# Patient Record
Sex: Male | Born: 1955 | Race: Black or African American | Hispanic: No | Marital: Married | Smoking: Former smoker
Health system: Southern US, Community
[De-identification: ages and names within clinical notes are randomized; demographics above are authoritative.]

## PROBLEM LIST (undated history)

## (undated) DIAGNOSIS — T4145XA Adverse effect of unspecified anesthetic, initial encounter: Secondary | ICD-10-CM

## (undated) DIAGNOSIS — T8859XA Other complications of anesthesia, initial encounter: Secondary | ICD-10-CM

## (undated) DIAGNOSIS — N529 Male erectile dysfunction, unspecified: Secondary | ICD-10-CM

## (undated) DIAGNOSIS — T7840XA Allergy, unspecified, initial encounter: Secondary | ICD-10-CM

## (undated) DIAGNOSIS — I10 Essential (primary) hypertension: Secondary | ICD-10-CM

## (undated) DIAGNOSIS — E785 Hyperlipidemia, unspecified: Secondary | ICD-10-CM

## (undated) DIAGNOSIS — E669 Obesity, unspecified: Secondary | ICD-10-CM

## (undated) DIAGNOSIS — Z973 Presence of spectacles and contact lenses: Secondary | ICD-10-CM

## (undated) DIAGNOSIS — K219 Gastro-esophageal reflux disease without esophagitis: Secondary | ICD-10-CM

## (undated) DIAGNOSIS — L209 Atopic dermatitis, unspecified: Secondary | ICD-10-CM

## (undated) HISTORY — DX: Essential (primary) hypertension: I10

## (undated) HISTORY — PX: PATELLA RECONSTRUCTION: SHX736

## (undated) HISTORY — DX: Obesity, unspecified: E66.9

## (undated) HISTORY — DX: Gastro-esophageal reflux disease without esophagitis: K21.9

## (undated) HISTORY — DX: Atopic dermatitis, unspecified: L20.9

## (undated) HISTORY — PX: POLYPECTOMY: SHX149

## (undated) HISTORY — DX: Hyperlipidemia, unspecified: E78.5

## (undated) HISTORY — DX: Male erectile dysfunction, unspecified: N52.9

## (undated) HISTORY — PX: TONSILLECTOMY: SUR1361

## (undated) HISTORY — DX: Allergy, unspecified, initial encounter: T78.40XA

---

## 1988-12-24 HISTORY — PX: LARYNGOSCOPY: SUR817

## 1993-12-24 HISTORY — PX: ARTHROSCOPIC REPAIR ACL: SUR80

## 1998-06-23 ENCOUNTER — Encounter: Admission: RE | Admit: 1998-06-23 | Discharge: 1998-09-21 | Payer: Self-pay | Admitting: *Deleted

## 1998-09-16 ENCOUNTER — Ambulatory Visit (HOSPITAL_COMMUNITY): Admission: RE | Admit: 1998-09-16 | Discharge: 1998-09-16 | Payer: Self-pay | Admitting: Gastroenterology

## 2000-12-24 HISTORY — PX: DORSAL COMPARTMENT RELEASE: SHX1474

## 2001-02-19 ENCOUNTER — Ambulatory Visit (HOSPITAL_BASED_OUTPATIENT_CLINIC_OR_DEPARTMENT_OTHER): Admission: RE | Admit: 2001-02-19 | Discharge: 2001-02-19 | Payer: Self-pay | Admitting: Orthopedic Surgery

## 2005-12-07 ENCOUNTER — Emergency Department (HOSPITAL_COMMUNITY): Admission: EM | Admit: 2005-12-07 | Discharge: 2005-12-07 | Payer: Self-pay | Admitting: Emergency Medicine

## 2006-01-08 ENCOUNTER — Encounter: Admission: RE | Admit: 2006-01-08 | Discharge: 2006-01-08 | Payer: Self-pay | Admitting: Family Medicine

## 2006-06-21 ENCOUNTER — Ambulatory Visit: Payer: Self-pay | Admitting: Family Medicine

## 2006-06-27 ENCOUNTER — Encounter: Admission: RE | Admit: 2006-06-27 | Discharge: 2006-06-27 | Payer: Self-pay | Admitting: Family Medicine

## 2006-12-18 ENCOUNTER — Ambulatory Visit: Payer: Self-pay | Admitting: Family Medicine

## 2006-12-19 ENCOUNTER — Ambulatory Visit: Payer: Self-pay | Admitting: Family Medicine

## 2006-12-20 ENCOUNTER — Ambulatory Visit: Payer: Self-pay | Admitting: Family Medicine

## 2007-01-14 ENCOUNTER — Ambulatory Visit: Payer: Self-pay | Admitting: Family Medicine

## 2007-01-24 ENCOUNTER — Ambulatory Visit: Payer: Self-pay | Admitting: Family Medicine

## 2007-02-11 ENCOUNTER — Ambulatory Visit: Payer: Self-pay | Admitting: Family Medicine

## 2007-02-24 ENCOUNTER — Ambulatory Visit: Payer: Self-pay | Admitting: Family Medicine

## 2007-03-25 ENCOUNTER — Ambulatory Visit: Payer: Self-pay | Admitting: Family Medicine

## 2007-06-18 ENCOUNTER — Ambulatory Visit: Payer: Self-pay | Admitting: Family Medicine

## 2007-08-11 ENCOUNTER — Ambulatory Visit: Payer: Self-pay | Admitting: Family Medicine

## 2007-08-15 ENCOUNTER — Encounter: Admission: RE | Admit: 2007-08-15 | Discharge: 2007-08-15 | Payer: Self-pay | Admitting: Family Medicine

## 2007-09-12 ENCOUNTER — Ambulatory Visit: Payer: Self-pay | Admitting: Family Medicine

## 2007-09-26 ENCOUNTER — Ambulatory Visit: Payer: Self-pay | Admitting: Family Medicine

## 2007-12-12 ENCOUNTER — Ambulatory Visit: Payer: Self-pay | Admitting: Family Medicine

## 2008-03-29 ENCOUNTER — Ambulatory Visit: Payer: Self-pay | Admitting: Family Medicine

## 2008-03-31 ENCOUNTER — Ambulatory Visit: Payer: Self-pay | Admitting: Family Medicine

## 2008-05-14 ENCOUNTER — Ambulatory Visit: Payer: Self-pay | Admitting: Family Medicine

## 2008-05-28 ENCOUNTER — Ambulatory Visit: Payer: Self-pay | Admitting: Family Medicine

## 2008-06-01 ENCOUNTER — Ambulatory Visit: Payer: Self-pay | Admitting: Family Medicine

## 2008-06-18 ENCOUNTER — Ambulatory Visit: Payer: Self-pay | Admitting: Family Medicine

## 2008-07-09 ENCOUNTER — Ambulatory Visit: Payer: Self-pay | Admitting: Family Medicine

## 2008-08-03 ENCOUNTER — Ambulatory Visit: Payer: Self-pay | Admitting: Family Medicine

## 2008-08-20 ENCOUNTER — Ambulatory Visit: Payer: Self-pay | Admitting: Family Medicine

## 2008-08-24 ENCOUNTER — Encounter: Admission: RE | Admit: 2008-08-24 | Discharge: 2008-08-24 | Payer: Self-pay | Admitting: Family Medicine

## 2008-08-24 ENCOUNTER — Ambulatory Visit: Payer: Self-pay | Admitting: Family Medicine

## 2008-09-22 ENCOUNTER — Ambulatory Visit: Payer: Self-pay | Admitting: Family Medicine

## 2008-11-08 ENCOUNTER — Ambulatory Visit: Payer: Self-pay | Admitting: Family Medicine

## 2008-11-22 ENCOUNTER — Ambulatory Visit: Payer: Self-pay | Admitting: Family Medicine

## 2008-11-25 ENCOUNTER — Encounter: Admission: RE | Admit: 2008-11-25 | Discharge: 2008-11-25 | Payer: Self-pay | Admitting: Family Medicine

## 2008-11-26 ENCOUNTER — Ambulatory Visit: Payer: Self-pay | Admitting: Family Medicine

## 2009-04-11 ENCOUNTER — Ambulatory Visit: Payer: Self-pay | Admitting: Family Medicine

## 2009-05-13 ENCOUNTER — Ambulatory Visit: Payer: Self-pay | Admitting: Family Medicine

## 2009-06-17 ENCOUNTER — Ambulatory Visit: Payer: Self-pay | Admitting: Family Medicine

## 2009-07-11 ENCOUNTER — Ambulatory Visit: Payer: Self-pay | Admitting: Family Medicine

## 2009-07-21 ENCOUNTER — Ambulatory Visit: Payer: Self-pay | Admitting: Family Medicine

## 2009-08-08 ENCOUNTER — Emergency Department (HOSPITAL_COMMUNITY): Admission: EM | Admit: 2009-08-08 | Discharge: 2009-08-09 | Payer: Self-pay | Admitting: Emergency Medicine

## 2009-10-07 ENCOUNTER — Ambulatory Visit: Payer: Self-pay | Admitting: Family Medicine

## 2010-02-06 ENCOUNTER — Ambulatory Visit: Payer: Self-pay | Admitting: Family Medicine

## 2010-02-09 ENCOUNTER — Ambulatory Visit: Payer: Self-pay | Admitting: Family Medicine

## 2010-02-23 ENCOUNTER — Ambulatory Visit: Payer: Self-pay | Admitting: Family Medicine

## 2010-04-10 ENCOUNTER — Ambulatory Visit: Payer: Self-pay | Admitting: Family Medicine

## 2010-04-13 ENCOUNTER — Ambulatory Visit: Payer: Self-pay | Admitting: Family Medicine

## 2010-05-10 ENCOUNTER — Ambulatory Visit: Payer: Self-pay | Admitting: Family Medicine

## 2010-06-21 ENCOUNTER — Emergency Department (HOSPITAL_COMMUNITY): Admission: EM | Admit: 2010-06-21 | Discharge: 2010-06-21 | Payer: Self-pay | Admitting: Emergency Medicine

## 2010-08-18 ENCOUNTER — Ambulatory Visit: Payer: Self-pay | Admitting: Family Medicine

## 2010-08-24 ENCOUNTER — Emergency Department (HOSPITAL_COMMUNITY): Admission: EM | Admit: 2010-08-24 | Discharge: 2010-08-24 | Payer: Self-pay | Admitting: Emergency Medicine

## 2010-08-29 ENCOUNTER — Ambulatory Visit: Payer: Self-pay | Admitting: Family Medicine

## 2010-09-28 ENCOUNTER — Ambulatory Visit: Payer: Self-pay | Admitting: Family Medicine

## 2011-02-19 ENCOUNTER — Ambulatory Visit (INDEPENDENT_AMBULATORY_CARE_PROVIDER_SITE_OTHER): Payer: BC Managed Care – PPO | Admitting: Family Medicine

## 2011-02-19 DIAGNOSIS — Z79899 Other long term (current) drug therapy: Secondary | ICD-10-CM

## 2011-02-19 DIAGNOSIS — R42 Dizziness and giddiness: Secondary | ICD-10-CM

## 2011-03-02 ENCOUNTER — Encounter (INDEPENDENT_AMBULATORY_CARE_PROVIDER_SITE_OTHER): Payer: BC Managed Care – PPO | Admitting: Family Medicine

## 2011-03-02 DIAGNOSIS — M109 Gout, unspecified: Secondary | ICD-10-CM

## 2011-03-02 DIAGNOSIS — I10 Essential (primary) hypertension: Secondary | ICD-10-CM

## 2011-03-02 DIAGNOSIS — E785 Hyperlipidemia, unspecified: Secondary | ICD-10-CM

## 2011-03-02 DIAGNOSIS — Z0000 Encounter for general adult medical examination without abnormal findings: Secondary | ICD-10-CM

## 2011-03-11 LAB — LIPASE, BLOOD: Lipase: 23 U/L (ref 11–59)

## 2011-03-11 LAB — CBC
Hemoglobin: 16 g/dL (ref 13.0–17.0)
MCH: 27.7 pg (ref 26.0–34.0)
RBC: 5.79 MIL/uL (ref 4.22–5.81)
RDW: 16.5 % — ABNORMAL HIGH (ref 11.5–15.5)
WBC: 7.4 10*3/uL (ref 4.0–10.5)

## 2011-03-11 LAB — COMPREHENSIVE METABOLIC PANEL
ALT: 15 U/L (ref 0–53)
Alkaline Phosphatase: 71 U/L (ref 39–117)
CO2: 21 mEq/L (ref 19–32)
Chloride: 102 mEq/L (ref 96–112)
GFR calc non Af Amer: >60 mL/min (ref >60)
Glucose, Bld: 119 mg/dL — ABNORMAL HIGH (ref 70–99)
Potassium: 3.7 mEq/L (ref 3.5–5.1)
Sodium: 133 mEq/L — ABNORMAL LOW (ref 135–145)
Total Bilirubin: 0.9 mg/dL (ref 0.3–1.2)

## 2011-03-11 LAB — DIFFERENTIAL
Basophils Absolute: 0 10*3/uL (ref 0.0–0.1)
Lymphocytes Relative: 22 % (ref 12–46)
Neutro Abs: 5.2 10*3/uL (ref 1.7–7.7)
Neutrophils Relative %: 70 % (ref 43–77)

## 2011-03-31 LAB — POCT I-STAT, CHEM 8
BUN: 21 mg/dL (ref 6–23)
Calcium, Ion: 1.03 mmol/L — ABNORMAL LOW (ref 1.12–1.32)
Chloride: 106 meq/L (ref 96–112)
Creatinine, Ser: 1 mg/dL (ref 0.4–1.5)
Glucose, Bld: 88 mg/dL (ref 70–99)
HCT: 43 % (ref 39.0–52.0)
Hemoglobin: 14.6 g/dL (ref 13.0–17.0)
Potassium: 4.5 mEq/L (ref 3.5–5.1)
Sodium: 137 mEq/L (ref 135–145)
TCO2: 23 mmol/L (ref 0–100)

## 2011-03-31 LAB — D-DIMER, QUANTITATIVE: D-Dimer, Quant: <0.22 ug{FEU}/mL (ref 0.00–0.48)

## 2011-03-31 LAB — POCT CARDIAC MARKERS
CKMB, poc: 1.2 ng/mL (ref 1.0–8.0)
CKMB, poc: <1 ng/mL — ABNORMAL LOW (ref 1.0–8.0)
Myoglobin, poc: 67.5 ng/mL (ref 12–200)
Myoglobin, poc: 89.3 ng/mL (ref 12–200)
Troponin i, poc: <0.05 ng/mL (ref 0.00–0.09)
Troponin i, poc: <0.05 ng/mL (ref 0.00–0.09)

## 2011-04-02 ENCOUNTER — Ambulatory Visit (INDEPENDENT_AMBULATORY_CARE_PROVIDER_SITE_OTHER): Payer: BC Managed Care – PPO | Admitting: Family Medicine

## 2011-04-02 DIAGNOSIS — E291 Testicular hypofunction: Secondary | ICD-10-CM

## 2011-04-02 DIAGNOSIS — Z719 Counseling, unspecified: Secondary | ICD-10-CM

## 2011-04-02 DIAGNOSIS — E785 Hyperlipidemia, unspecified: Secondary | ICD-10-CM

## 2011-04-04 ENCOUNTER — Ambulatory Visit (INDEPENDENT_AMBULATORY_CARE_PROVIDER_SITE_OTHER): Payer: BC Managed Care – PPO | Admitting: Family Medicine

## 2011-04-04 DIAGNOSIS — E291 Testicular hypofunction: Secondary | ICD-10-CM

## 2011-05-11 NOTE — Op Note (Signed)
[place]. [facility]  Patient:    Jay Delacruz, Jay Delacruz                      MRN: [id number] Proc. Date: 02/19/01 Adm. Date:  02/19/01 Attending:  Katy Fitch. Naaman Plummer., M.D.                           Operative Report  PREOPERATIVE DIAGNOSIS:  Status post arthrodesis - right wrist with development of severe ______ tenosynovitis - right extensor pollicis longus at third dorsal compartment.  POSTOPERATIVE DIAGNOSES:  Status post arthrodesis - right wrist with development of severe ______ tenosynovitis - right extensor pollicis longus at third dorsal compartment.  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  OPERATIONS: 1. Tenolysis of extensor pollicis longus with release of reformed third dorsal    compartment. 2. Removal of screw from floor of third dorsal compartment.  SURGEON:  Katy Fitch. Sypher, Montez Hageman., M.D.  ASSISTANT:  Marveen Reeks Dasnoit, P.A.-C.  ANESTHESIA:  General by LMA.  SUPERVISING ANESTHESIOLOGIST:  Dr. Burna Forts.  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  Due to a failure to respond to nonoperative measures, he underwent arthrodesis of his wrist with a semitubular plate.  Postoperatively, he had excellent pain relief and has gone on to a near complete arthrodesis.  His fusion is still maturing.  For the past four weeks, he has chronic tenosynovitis of the extensor pollicis longus overlying the distal radius.  At the time of surgery, Listers tubercle was removed and his EPL was left in the subcutaneous region.  However, it was apparent that his EPL was causing friction against one of the dorsal screws, therefore, we recommended exploration of the third dorsal compartment and screw removal.  The prime indication  is to prevent rupture of his extensor pollicis longus tendon.  PROCEDURE:  [name] was brought to the operating room and placed in supine position on the operating table.  Following induction of general anesthesia by LMA, the right arm was prepped with Betadine soap and solution and sterilely draped.  Following exsanguination of the limb with Esmarch bandage, arterial tourniquet was inflated to 230 mmHg.  The procedure commenced with excision of a portion of the previous surgical scar on the proximal half of the wound.  Subcutaneous tissues were carefully divided, taking care to identify the extensor retinaculum that had reformed over the extensor tendons.  The fourth dorsal compartment was entered and the third dorsal compartment was entered proximal to the region of the offending screw.  The musculotendinous junction was released and the third dorsal compartment was found to be rather stenotic.  The extensor retinaculum was released over the EPL over a distance of 5 cm to the base of the thumb metacarpal.  There was noted to be a very prominent screw head at the floor of the compartment.  The other screws were covered with tenosynovium and periosteum and did not appear to be a problem.  The cortical screw was removed with a screwdriver.  Thereafter, the extensor pollicis longus underwent complete tenolysis and removal of all hypertrophic synovium.  There was no sign of impending rupture, however, approximately 10% of the fibers of the tendon had been abraded.  The remaining screws appeared to be satisfactory.  The wound was irrigated and repaired with intradermal 3-0 Prolene.  There were no apparent complications.  The tourniquet was released and Mr. Hjort was transferred to the recovery room with stable vital signs.  He will be discharged to home with prescriptions for Percocet 5/325 1-2 tablets p.o. q.4-6h. p.r.n. pain.  Also, Keflex 500 mg one p.o. q.8h. x 4  days as a prophylactic antibiotic.   He will return to see [place] in the office in approximately one week for x-ray and dressing change and advancement in his therapy program. DD:  02/19/01 TD:  02/19/01 Job: [id number]

## 2011-05-11 NOTE — Consult Note (Signed)
Jay Delacruz, Jay Delacruz                ACCOUNT NO.:  [id number]   MEDICAL RECORD NO.:  [id number]          PATIENT TYPE:  EMS   LOCATION:  ED                           FACILITY:  [facility]   PHYSICIAN:  Angelia Mould. Derrell Lolling, M.D.DATE OF BIRTH:  [birth year]   DATE OF CONSULTATION:  12/07/2005  DATE OF DISCHARGE:                                   CONSULTATION   CHIEF COMPLAINT:  Pain and swelling left inguinal area.   HISTORY OF PRESENT ILLNESS:  This is a 55 year old black man without any  prior history of any soft tissue infections and no prior history of  diabetes. He has a several day history of progressive pain and swelling in  his left groin. He saw Dr. Sharlot Gowda about 3 days ago because of pain and  swelling in his left groin and was started on doxicycline. The patient's  pain and swelling has progressed. Dr. Susann Givens sent him to the [facility]  emergency room for my evaluation.   PAST HISTORY:  Not very remarkable. He has hypertension and hyperlipidemia.   CURRENT MEDICATIONS:  Lipitor and hydrochlorothiazide.   ALLERGIES:  He denies any drug allergies.   PHYSICAL EXAMINATION:  GENERAL:  A very pleasant middle-aged black man in no  distress.  VITAL SIGNS:  He is afebrile.  ABDOMEN:  Soft and nontender, no palpable mass. There are no hernias, liver  and spleen not enlarged.  GENITOURINARY:  There is an 8 cm abscess in the medial aspect of the left  inguinal region. The penis, scrotum and testes were completely normal and  are spared. There is no skin necrosis or signs of fasciitis.   DESCRIPTION OF PROCEDURE:  This area was prepped and draped in a sterile  fashion. One percent Xylocaine with epinephrine was used as a local  infiltration anesthetic. A 6 cm transverse incision was made and I entered  the large abscess cavity. This was digitally explored and loculations were  broken up. It appeared to be a simple abscess and did not look like a MRSA  abscess. Aerobic and anaerobic  cultures were taken. The wound was irrigated  and packed loosely with iodoform gauze. Clean bandages were placed. The  patient tolerated the procedure well without any problems and felt much  better.   IMPRESSION:  Pyogenic abscess left inguinal area.   PLAN:  1.  Incision and drainage performed in the emergency room.  2.  Continue doxicycline 100 mg p.o. b.i.d.  3.  The patient is to change the bandage as instructed.  4.  The patient is see me in the office in 3-4 days as instructed.   The patient is instructed that if the packing comes out he is not to repack  it.      Angelia Mould. Derrell Lolling, M.D.  Electronically Signed     HMI/MEDQ  D:  12/07/2005  T:  12/10/2005  Job:  [id number]   cc:   Sharlot Gowda, M.D.  Fax: [phone]

## 2011-05-14 ENCOUNTER — Encounter: Payer: Self-pay | Admitting: Medical

## 2011-07-06 ENCOUNTER — Encounter: Payer: Self-pay | Admitting: Family Medicine

## 2011-07-09 ENCOUNTER — Ambulatory Visit (INDEPENDENT_AMBULATORY_CARE_PROVIDER_SITE_OTHER): Payer: BC Managed Care – PPO | Admitting: Family Medicine

## 2011-07-09 ENCOUNTER — Encounter: Payer: Self-pay | Admitting: Family Medicine

## 2011-07-09 ENCOUNTER — Other Ambulatory Visit: Payer: Self-pay | Admitting: Family Medicine

## 2011-07-09 VITALS — BP 126/80 | HR 80 | Wt 263.0 lb

## 2011-07-09 DIAGNOSIS — R946 Abnormal results of thyroid function studies: Secondary | ICD-10-CM

## 2011-07-09 LAB — T4: T4, Total: 5.4 ug/dL (ref 5.0–12.5)

## 2011-07-09 LAB — T3: T3, Total: 61.9 ng/dL — ABNORMAL LOW (ref 80.0–204.0)

## 2011-07-09 NOTE — Progress Notes (Signed)
  Subjective:    Patient ID: Jay Delacruz, male    DOB: 06/10/1956, 55 y.o.   MRN: [id number]  HPI He is here for consult. He recently was evaluated by urology and blood screening showed abnormal thyroid functions. He is having no tachycardia, skin or hair changes.  Review of Systems     Objective:   Physical Exam Alert and in no distress otherwise not examined       Assessment & Plan:  Abnormal thyroid function tests I will repeat the tests. I discussed the possibility of this being lab error. If not I will discuss this further with endocrinology.

## 2011-07-11 ENCOUNTER — Telehealth: Payer: Self-pay

## 2011-07-11 NOTE — Telephone Encounter (Signed)
Called pt left message to call me back

## 2011-07-11 NOTE — Telephone Encounter (Signed)
Pt has been informed.

## 2011-08-14 ENCOUNTER — Other Ambulatory Visit: Payer: Self-pay | Admitting: Family Medicine

## 2011-08-21 ENCOUNTER — Other Ambulatory Visit: Payer: Self-pay | Admitting: Family Medicine

## 2011-09-09 ENCOUNTER — Other Ambulatory Visit: Payer: Self-pay | Admitting: Family Medicine

## 2011-10-02 DIAGNOSIS — Z0289 Encounter for other administrative examinations: Secondary | ICD-10-CM

## 2011-11-14 ENCOUNTER — Ambulatory Visit: Payer: BC Managed Care – PPO | Admitting: Family Medicine

## 2011-12-03 ENCOUNTER — Other Ambulatory Visit: Payer: Self-pay | Admitting: Family Medicine

## 2011-12-03 NOTE — Telephone Encounter (Signed)
Is this ok?

## 2011-12-04 ENCOUNTER — Other Ambulatory Visit: Payer: Self-pay

## 2011-12-04 NOTE — Telephone Encounter (Signed)
Med called in

## 2011-12-04 NOTE — Telephone Encounter (Signed)
Called cialis in

## 2011-12-10 ENCOUNTER — Other Ambulatory Visit: Payer: Self-pay | Admitting: Family Medicine

## 2012-01-29 ENCOUNTER — Other Ambulatory Visit: Payer: Self-pay | Admitting: Family Medicine

## 2012-03-10 ENCOUNTER — Other Ambulatory Visit: Payer: Self-pay | Admitting: Family Medicine

## 2012-03-24 ENCOUNTER — Other Ambulatory Visit (INDEPENDENT_AMBULATORY_CARE_PROVIDER_SITE_OTHER): Payer: Self-pay | Admitting: General Surgery

## 2012-03-24 ENCOUNTER — Encounter: Payer: Self-pay | Admitting: Medical

## 2012-03-24 ENCOUNTER — Ambulatory Visit (INDEPENDENT_AMBULATORY_CARE_PROVIDER_SITE_OTHER): Payer: BC Managed Care – PPO | Admitting: Medical

## 2012-03-24 ENCOUNTER — Ambulatory Visit (INDEPENDENT_AMBULATORY_CARE_PROVIDER_SITE_OTHER): Payer: BC Managed Care – PPO | Admitting: General Surgery

## 2012-03-24 VITALS — BP 140/80 | HR 108 | Temp 97.0°F | Resp 12 | Ht 73.0 in | Wt 265.0 lb

## 2012-03-24 VITALS — BP 132/80 | HR 92 | Temp 98.4°F | Resp 16 | Wt 267.0 lb

## 2012-03-24 DIAGNOSIS — L723 Sebaceous cyst: Secondary | ICD-10-CM

## 2012-03-24 DIAGNOSIS — L089 Local infection of the skin and subcutaneous tissue, unspecified: Secondary | ICD-10-CM | POA: Insufficient documentation

## 2012-03-24 MED ORDER — HYDROCODONE-ACETAMINOPHEN 5-500 MG PO TABS: 1.0000 | ORAL_TABLET | ORAL | Status: DC | PRN

## 2012-03-24 NOTE — Patient Instructions (Signed)
May remove outer dressing Wednesday AM and shower.  Once packing is wet, may remove packing.  OK to be out of work tomorrow if requiring pain medication.

## 2012-03-24 NOTE — Assessment & Plan Note (Signed)
I&D performed at bedside. Removed wall of sac.   Follow up in 2-3 weeks.

## 2012-03-24 NOTE — Progress Notes (Signed)
Incision and Drainage Procedure Note  Pre-operative Diagnosis: infected sebaceous cyst of back  Post-operative Diagnosis: same  Indications: Pain, redness of known sebaceous cyst  Anesthesia: 1% lidocaine with epinephrine  Procedure Details  The procedure, risks and complications have been discussed in detail (including, infection, bleeding, need for additional procedures) with the patient, and the patient has signed consent to the procedure.  The patient was informed that the wound would be left open.    The skin was sterilely prepped and draped over the affected area in the usual fashion. After adequate local anesthesia, I&D with a #11 blade was performed on the midline back. Purulent drainage: present along with significant sebaceous material.  Sac removed as well as cheesy material The patient was observed until stable.  Findings: Sebaceous material and pus  EBL: min cc's  Drains: none  Condition: Tolerated procedure well   Complications: none.

## 2012-03-24 NOTE — Patient Instructions (Signed)
I have scheduled you an appointment at [facility] today at 3:45pm to have the sebaceous cyst excised/drained.  There contact info is: [address] [place], [place] [address] [phone]

## 2012-03-24 NOTE — Progress Notes (Signed)
  Subjective:   HPI  Jay Delacruz is a 57 y.o. male who presents with painful swollen area on his back.  He saw Dr. Susann Givens here for similar bump a year ago, but it wasn't bothering him at that time.   Over this past weekend though is has gotten bigger, is tender now, and he wants it cut out.  He denies redness, warmth, drainage. He does report that in the past his wife has squeezed cheesy material out of it.  No other aggravating or relieving factors.    No other c/o.  The following portions of the patient's history were reviewed and updated as appropriate: allergies, current medications, past family history, past medical history, past social history, past surgical history and problem list.  Past Medical History  Diagnosis Date  . Obesity   . Hemorrhoids   . Hypertension   . ED (erectile dysfunction)   . Gout   . Atopic dermatitis     Allergies  Allergen Reactions  . Lisinopril Swelling    SWELLING TO FACE AND TONGUE     Review of Systems Gen: no fever, chills GI: no NVD ROS reviewed and was negative other than noted in HPI or above.    Objective:   Physical Exam  General appearance: alert, no distress, WD/WN Skin: mid back medially with 3.4 cm+ diameter raised tender lesion with small pore opening, slight warmth, but no fluctuance, no erythema, no current drainage   Assessment and Plan :      Encounter Diagnosis  Name Primary?  . Sebaceous cyst Yes   I scheduled him appt at [facility] today for cyst drainage, 4:15pm appt.

## 2012-03-24 NOTE — Progress Notes (Signed)
Patient ID: Jay Delacruz, male   DOB: 1956-08-01, 56 y.o.   MRN: [id number]  No chief complaint on file. sebaceous cyst of back  HPI Jay Delacruz is a 56 y.o. male.  HPI Patient is a 56 year old male with a two-year history of a mass in the middle of his back. It has not gotten significantly larger over this time period. His wife periodically is able to express cheesy material from it. It has not been infected that he knows of. However it over the past 2 days he started having increasing pain and redness over the mass. He is referred here for evaluation.  Past Medical History  Diagnosis Date  . Obesity   . Hemorrhoids   . Hypertension   . ED (erectile dysfunction)   . Gout   . Atopic dermatitis     No past surgical history on file.  No family history on file.  Social History History  Substance Use Topics  . Smoking status: Former Games developer  . Smokeless tobacco: Never Used  . Alcohol Use: Not on file    Allergies  Allergen Reactions  . Lisinopril Swelling    SWELLING TO FACE AND TONGUE    Current Outpatient Prescriptions  Medication Sig Dispense Refill  . allopurinol (ZYLOPRIM) 300 MG tablet Take 300 mg by mouth daily.        Marland Kitchen atorvastatin (LIPITOR) 40 MG tablet TAKE 1 TABLET EVERY DAY  30 tablet  0  . CIALIS 20 MG tablet TAKE 1 TABLET BY MOUTH ONCE A DAY AS NEEDED  5 tablet  9  . losartan-hydrochlorothiazide (HYZAAR) 100-12.5 MG per tablet TAKE 1 TABLET BY MOUTH ONCE A DAY  30 tablet  11  . LOVAZA 1 G capsule TAKE 4 CAPSULES BY MOUTH ONCE A DAY  120 capsule  PRN  . testosterone (TESTIM) 50 MG/5GM GEL Place 5 g onto the skin daily.        Marland Kitchen HYDROcodone-acetaminophen (VICODIN) 5-500 MG per tablet Take 1-2 tablets by mouth every 4 (four) hours as needed for pain.  20 tablet  0  . DISCONTD: losartan-hydrochlorothiazide (HYZAAR) 50-12.5 MG per tablet Take 1 tablet by mouth daily.          Review of Systems Review of Systems  All other systems reviewed and are  negative.    Blood pressure 140/80, pulse 108, temperature 97 F (36.1 C), temperature source Temporal, resp. rate 12, height 6\' 1"  (1.854 m), weight 265 lb (120.203 kg).  Physical Exam Physical Exam  Constitutional: He is oriented to person, place, and time. He appears well-developed and well-nourished. No distress.  HENT:  Head: Normocephalic and atraumatic.  Mouth/Throat: No oropharyngeal exudate.  Eyes: Conjunctivae are normal. Pupils are equal, round, and reactive to light. No scleral icterus.  Neck: Normal range of motion. Neck supple. No tracheal deviation present. No thyromegaly present.  Cardiovascular: Normal rate, regular rhythm, normal heart sounds and intact distal pulses.   Pulmonary/Chest: Effort normal. No respiratory distress.    Abdominal: Soft. Bowel sounds are normal.  Musculoskeletal: Normal range of motion.  Neurological: He is alert and oriented to person, place, and time. He has normal reflexes. Coordination normal.  Skin: Skin is warm and dry. No rash noted. He is not diaphoretic. No erythema.  Psychiatric: He has a normal mood and affect. His behavior is normal. Judgment and thought content normal.   Assessment/Plan    Infected sebaceous cyst of back I&D performed at bedside. Removed wall of sac.  Follow up in 2-3 weeks.        Shayona Hibbitts 03/24/2012, 4:40 PM

## 2012-03-27 LAB — WOUND CULTURE: Gram Stain: NONE SEEN

## 2012-04-09 ENCOUNTER — Other Ambulatory Visit: Payer: Self-pay | Admitting: Family Medicine

## 2012-04-14 ENCOUNTER — Ambulatory Visit (INDEPENDENT_AMBULATORY_CARE_PROVIDER_SITE_OTHER): Payer: BC Managed Care – PPO | Admitting: General Surgery

## 2012-04-14 VITALS — BP 142/86 | HR 86 | Temp 97.5°F | Resp 16 | Ht 72.0 in | Wt 264.4 lb

## 2012-04-14 DIAGNOSIS — L723 Sebaceous cyst: Secondary | ICD-10-CM

## 2012-04-14 DIAGNOSIS — L089 Local infection of the skin and subcutaneous tissue, unspecified: Secondary | ICD-10-CM

## 2012-04-14 NOTE — Progress Notes (Signed)
HISTORY: Pt is 3 weeks s/p I&D of infected sebaceous cyst of back.  He has been doing well.  He denies pain or drainage from the wound.      EXAM: General:  Alert and oriented Incision:  Back wound with scabbed-over incision.  No evidence of continued mass   MICROBIOLOGY: DIPTHEROIDS   ASSESSMENT AND PLAN:   Infected sebaceous cyst of back Nearly completely healed.   Follow up PRN.       Maudry Diego, MD Surgical Oncology, General & Endocrine Surgery [facility]Wellington Hampshire, MD, MD Ronnald Nian, MD

## 2012-04-14 NOTE — Assessment & Plan Note (Signed)
Nearly completely healed.   Follow up PRN.

## 2012-04-14 NOTE — Patient Instructions (Signed)
Call for recurrent problems with back cyst.

## 2012-04-21 ENCOUNTER — Encounter: Payer: Self-pay | Admitting: Medical

## 2012-04-21 ENCOUNTER — Ambulatory Visit (INDEPENDENT_AMBULATORY_CARE_PROVIDER_SITE_OTHER): Payer: BC Managed Care – PPO | Admitting: Medical

## 2012-04-21 VITALS — BP 120/80 | HR 76 | Temp 98.8°F | Resp 16 | Wt 260.0 lb

## 2012-04-21 DIAGNOSIS — M109 Gout, unspecified: Secondary | ICD-10-CM

## 2012-04-21 MED ORDER — INDOMETHACIN 50 MG PO CAPS: 50.0000 mg | ORAL_CAPSULE | Freq: Three times a day (TID) | ORAL | Status: AC

## 2012-04-21 MED ORDER — COLCHICINE 0.6 MG PO TABS: 0.6000 mg | ORAL_TABLET | Freq: Two times a day (BID) | ORAL | Status: DC

## 2012-04-21 NOTE — Patient Instructions (Signed)
For gout flare up,  Take Colcrys twice daily for flare.   This used to be like the colchicine you took every hour.  Just use this twice daily the next few days  If needed, also take the Indomethacin 50mg , 1 capsule up to 3 times daily for pain and inflammation.  Use this for as little a time as possible  Rest, stay off the foot the next few days  If worse or not improving by middle to late this week, then call or return  Drink plenty of water  Don't drink any beer this week, and avoid lots of meat this week

## 2012-04-21 NOTE — Progress Notes (Signed)
Subjective:   HPI  Jay Delacruz is a 56 y.o. male who presents for gout flare up.  This flare up started yesterday in right 3rd toe.  Normally has flare up in great toe.   He reports lots of pain.  He uses stand up tractor at work, uses feet constantly, and was able to complete job today, but doesn't think he can work like this tomorrow without treatment.  He notes some swelling of 2nd and 3rd toes on the right.  Denies redness or heat.  Feels like his normal gout flare up.  He has been drinking some beer this past week, forgot to take Allopurinol for 2 days last week, thinks this flared things up.    Hasn't had any attacks since starting Allopurinol 2011 or 2012?  Not sure.  In the past has used 2 little white pills for flare ups.  No other aggravating or relieving factors.    No other c/o.  The following portions of the patient's history were reviewed and updated as appropriate: allergies, current medications, past family history, past medical history, past social history, past surgical history and problem list.  Past Medical History  Diagnosis Date  . Obesity   . Hemorrhoids   . Hypertension   . ED (erectile dysfunction)   . Gout   . Atopic dermatitis     Allergies  Allergen Reactions  . Lisinopril Swelling    SWELLING TO FACE AND TONGUE   Review of Systems ROS reviewed and was negative other than noted in HPI or above.    Objective:   Physical Exam  General appearance: alert, no distress, WD/WN Pulses: 2+ symmetric MSK: right foot with tenderness over 2nd and 3rd toes, but no obvious erythema, swelling or warmth, otherwise feet nontender Neuro: normal sensation of feet   Assessment and Plan :     Encounter Diagnosis  Name Primary?  . Gout attack Yes   Script for Colcrys, Indocin if needed, note for out of work x 2-3 days if needed, hydrate well, no beer for now, c/t Allopurinol, call or return if worse or not improving.

## 2012-04-29 ENCOUNTER — Ambulatory Visit (INDEPENDENT_AMBULATORY_CARE_PROVIDER_SITE_OTHER): Payer: BC Managed Care – PPO | Admitting: Family Medicine

## 2012-04-29 ENCOUNTER — Encounter: Payer: Self-pay | Admitting: Family Medicine

## 2012-04-29 VITALS — BP 118/78 | HR 76 | Ht 72.5 in | Wt 250.0 lb

## 2012-04-29 DIAGNOSIS — I10 Essential (primary) hypertension: Secondary | ICD-10-CM

## 2012-04-29 DIAGNOSIS — E785 Hyperlipidemia, unspecified: Secondary | ICD-10-CM

## 2012-04-29 DIAGNOSIS — N529 Male erectile dysfunction, unspecified: Secondary | ICD-10-CM

## 2012-04-29 DIAGNOSIS — Z0000 Encounter for general adult medical examination without abnormal findings: Secondary | ICD-10-CM

## 2012-04-29 DIAGNOSIS — M109 Gout, unspecified: Secondary | ICD-10-CM

## 2012-04-29 DIAGNOSIS — E291 Testicular hypofunction: Secondary | ICD-10-CM

## 2012-04-29 DIAGNOSIS — E669 Obesity, unspecified: Secondary | ICD-10-CM

## 2012-04-29 LAB — CBC WITH DIFFERENTIAL/PLATELET
Basophils Absolute: 0 10*3/uL (ref 0.0–0.1)
HCT: 45.7 % (ref 39.0–52.0)
Hemoglobin: 15.9 g/dL (ref 13.0–17.0)
Lymphocytes Relative: 34 % (ref 12–46)
Lymphs Abs: 1.8 10*3/uL (ref 0.7–4.0)
Monocytes Absolute: 0.5 10*3/uL (ref 0.1–1.0)
Monocytes Relative: 9 % (ref 3–12)
Neutro Abs: 3 10*3/uL (ref 1.7–7.7)
RBC: 5.76 MIL/uL (ref 4.22–5.81)
RDW: 15.7 % — ABNORMAL HIGH (ref 11.5–15.5)
WBC: 5.3 10*3/uL (ref 4.0–10.5)

## 2012-04-29 LAB — POCT URINALYSIS DIPSTICK
Bilirubin, UA: NEGATIVE
Glucose, UA: NEGATIVE
Ketones, UA: NEGATIVE
Leukocytes, UA: NEGATIVE

## 2012-04-29 NOTE — Progress Notes (Signed)
  Subjective:    Patient ID: Jay Delacruz, male    DOB: May 10, 1956, 56 y.o.   MRN: [id number]  HPI He is here for complete examination. He recently had a gout attack but did respond well to the medications. He did miss several days of work. He did have me fill out an FMLA form to help with this. He continues on other medications listed in the chart and is having no difficulty with them. He is followed by urology and is using testosterone subcutaneous pellets. He has started to lose some weight recently. He is having no difficulty from his blood pressure medications. He is separated and in the process of getting a divorce. Family history is unchanged.   Review of Systems Negative except as above    Objective:   Physical Exam BP 118/78  Pulse 76  Ht 6' 0.5" (1.842 m)  Wt 250 lb (113.399 kg)  BMI 33.44 kg/m2  General Appearance:    Alert, cooperative, no distress, appears stated age  Head:    Normocephalic, without obvious abnormality, atraumatic  Eyes:    PERRL, conjunctiva/corneas clear, EOM's intact, fundi    benign  Ears:    Normal TM's and external ear canals  Nose:   Nares normal, mucosa normal, no drainage or sinus   tenderness  Throat:   Lips, mucosa, and tongue normal; teeth and gums normal  Neck:   Supple, no lymphadenopathy;  thyroid:  no   enlargement/tenderness/nodules; no carotid   bruit or JVD  Back:    Spine nontender, no curvature, ROM normal, no CVA     tenderness  Lungs:     Clear to auscultation bilaterally without wheezes, rales or     ronchi; respirations unlabored  Chest Wall:    No tenderness or deformity   Heart:    Regular rate and rhythm, S1 and S2 normal, no murmur, rub   or gallop  Breast Exam:    No chest wall tenderness, masses or gynecomastia  Abdomen:     Soft, non-tender, nondistended, normoactive bowel sounds,    no masses, no hepatosplenomegaly  Genitalia:    Normal male external genitalia without lesions.  Testicles without masses.  No inguinal  hernias.  Rectal:    deferred   Extremities:   No clubbing, cyanosis or edema  Pulses:   2+ and symmetric all extremities  Skin:   Skin color, texture, turgor normal, no rashes or lesions  Lymph nodes:   Cervical, supraclavicular, and axillary nodes normal  Neurologic:   CNII-XII intact, normal strength, sensation and gait; reflexes 2+ and symmetric throughout          Psych:   Normal mood, affect, hygiene and grooming.           Assessment & Plan:   1. Routine general medical examination at a health care facility  POCT Urinalysis Dipstick, CBC with Differential, Comprehensive metabolic panel, Lipid panel  2. Gout  Uric Acid  3. Hyperlipidemia LDL goal < 100  Lipid panel  4. ED (erectile dysfunction)    5. Hypogonadism male    6. Hypertension    7. Obesity (BMI 30-39.9)     continue present medication regimen.

## 2012-04-30 LAB — COMPREHENSIVE METABOLIC PANEL
AST: 22 U/L (ref 0–37)
Albumin: 4.1 g/dL (ref 3.5–5.2)
BUN: 14 mg/dL (ref 6–23)
Calcium: 9.6 mg/dL (ref 8.4–10.5)
Chloride: 100 mEq/L (ref 96–112)
Potassium: 4.6 mEq/L (ref 3.5–5.3)

## 2012-04-30 LAB — LIPID PANEL: HDL: 47 mg/dL (ref >39)

## 2012-04-30 LAB — URIC ACID: Uric Acid, Serum: 4.1 mg/dL (ref 4.0–7.8)

## 2012-04-30 NOTE — Progress Notes (Signed)
Left message re- labs

## 2012-05-04 ENCOUNTER — Other Ambulatory Visit: Payer: Self-pay | Admitting: Family Medicine

## 2012-05-23 ENCOUNTER — Telehealth: Payer: Self-pay | Admitting: Family Medicine

## 2012-05-23 NOTE — Telephone Encounter (Signed)
LM

## 2012-06-06 ENCOUNTER — Telehealth: Payer: Self-pay | Admitting: Family Medicine

## 2012-06-06 NOTE — Telephone Encounter (Signed)
He needs an appointment to discuss this.

## 2012-06-06 NOTE — Telephone Encounter (Signed)
Left message pt needs appt to discuss quieting smoking

## 2012-06-09 ENCOUNTER — Ambulatory Visit (INDEPENDENT_AMBULATORY_CARE_PROVIDER_SITE_OTHER): Payer: BC Managed Care – PPO | Admitting: Family Medicine

## 2012-06-09 ENCOUNTER — Encounter: Payer: Self-pay | Admitting: Family Medicine

## 2012-06-09 VITALS — BP 130/84 | HR 83 | Wt 258.0 lb

## 2012-06-09 DIAGNOSIS — Z630 Problems in relationship with spouse or partner: Secondary | ICD-10-CM

## 2012-06-09 DIAGNOSIS — F17200 Nicotine dependence, unspecified, uncomplicated: Secondary | ICD-10-CM

## 2012-06-09 DIAGNOSIS — Z7189 Other specified counseling: Secondary | ICD-10-CM

## 2012-06-09 NOTE — Progress Notes (Signed)
  Subjective:    Patient ID: Jay Delacruz, male    DOB: 19-Dec-1956, 56 y.o.   MRN: [id number]  HPI He is here for consult concerning getting patches for smoking cessation. He has apparently used this in the past. He started smoking again when he got under stress dealing with an impending divorce.   Review of Systems     Objective:   Physical Exam alert and in no distress.       Assessment & Plan:   1. Counseling for marital and partner problems   2. Current smoker    after discussion with him I have recommended that he not start smoking cessation program until after he deals more effectively with the divorce. Discussed options with him concerning the legal issues. He seems to have good handle on the situation.

## 2012-08-30 ENCOUNTER — Other Ambulatory Visit: Payer: Self-pay | Admitting: Family Medicine

## 2012-09-24 ENCOUNTER — Other Ambulatory Visit: Payer: Self-pay | Admitting: Family Medicine

## 2012-09-25 ENCOUNTER — Ambulatory Visit: Payer: BC Managed Care – PPO | Admitting: Family Medicine

## 2012-10-02 ENCOUNTER — Other Ambulatory Visit: Payer: Self-pay | Admitting: Family Medicine

## 2012-11-03 ENCOUNTER — Telehealth: Payer: Self-pay | Admitting: Family Medicine

## 2012-11-06 ENCOUNTER — Encounter: Payer: Self-pay | Admitting: Medical

## 2012-11-06 ENCOUNTER — Ambulatory Visit (INDEPENDENT_AMBULATORY_CARE_PROVIDER_SITE_OTHER): Payer: BC Managed Care – PPO | Admitting: Medical

## 2012-11-06 VITALS — BP 120/84 | HR 68 | Temp 98.2°F | Resp 16 | Wt 248.0 lb

## 2012-11-06 DIAGNOSIS — H00019 Hordeolum externum unspecified eye, unspecified eyelid: Secondary | ICD-10-CM

## 2012-11-06 MED ORDER — ERYTHROMYCIN 5 MG/GM OP OINT: TOPICAL_OINTMENT | Freq: Four times a day (QID) | OPHTHALMIC | Status: DC

## 2012-11-06 NOTE — Telephone Encounter (Signed)
LM

## 2012-11-06 NOTE — Progress Notes (Signed)
Subjective: Here for 3 day hx/o right eye with redness, irritation, localized welling, clear watery drainage, and concerned for pink eye.  He does report hx/o stye and pink eye in the past, but no recent contacts with eye infection.  Wears glasses, last eye doctor visit 02/2012.  No hx/o glaucoma.  No pain.  No recent activity to cause FB in eye.  No other aggravating or relieving factors.    ROS negative  Objective: Gen: wd, wn, nad Skin: unremarkable Eyes: right lower lid laterally with round small 3 mm swelling suggestive of forming stye, some right conjunctival erythema, watery clear drainage, but otherwise bilat eyes with PERRLA, EOMi, no FB, otherwise unremarkable HEENT: unremarkable   Assessment:  Encounter Diagnosis  Name Primary?  Jay Delacruz Yes   Plan: Warm compresses, begin Emycin opth ointment.  recheck if not resolving in 1 wk.

## 2012-11-19 ENCOUNTER — Other Ambulatory Visit: Payer: Self-pay | Admitting: Family Medicine

## 2012-11-19 NOTE — Telephone Encounter (Signed)
Is this ok?

## 2012-11-23 ENCOUNTER — Other Ambulatory Visit: Payer: Self-pay | Admitting: Family Medicine

## 2013-01-05 ENCOUNTER — Ambulatory Visit (INDEPENDENT_AMBULATORY_CARE_PROVIDER_SITE_OTHER): Payer: BC Managed Care – PPO | Admitting: Medical

## 2013-01-05 ENCOUNTER — Encounter: Payer: Self-pay | Admitting: Medical

## 2013-01-05 VITALS — BP 118/80 | HR 72 | Temp 98.7°F | Wt 250.0 lb

## 2013-01-05 DIAGNOSIS — F17200 Nicotine dependence, unspecified, uncomplicated: Secondary | ICD-10-CM

## 2013-01-05 DIAGNOSIS — J40 Bronchitis, not specified as acute or chronic: Secondary | ICD-10-CM

## 2013-01-05 MED ORDER — AZITHROMYCIN 250 MG PO TABS: ORAL_TABLET | ORAL | Status: DC

## 2013-01-05 MED ORDER — NICOTINE 21 MG/24HR TD PT24: 1.0000 | MEDICATED_PATCH | TRANSDERMAL | Status: DC

## 2013-01-05 NOTE — Progress Notes (Signed)
Subjective: Here for 4-5 day hx/o cough and congestion, using some Mucinex.  Getting mucous coming up since the mucinex got on board.  Chest congestion, but no head congestion.  Sister just saw me for hospital f/u after being hospitalized for pneumonia.  They live together.   He doesn't feel bad, doesn't feel sluggish.  No fever.  Had 1 day last week where he had loose bowel and vomited once, but no other.  No wheezing or shortness of breath. No sinus pressure, no ear pain, no sore throat.  No other aggravating or relieving factors.  Wants to get script for patches to stop tobacco.  Smokes almost 2ppd, knows he needs to quit.    Past Medical History  Diagnosis Date  . Obesity   . Hemorrhoids   . Hypertension   . ED (erectile dysfunction)   . Gout   . Atopic dermatitis   . Hyperlipidemia    ROS as in subjective  Objective: Gen: wd, wn, nad Skin: warm, dry Heent: sinus nontender, TMs pearly, nares patent, pharynx normal Oral MMM, no lesions Neck supple, no lymphadenopathy, no mass, no thyromegaly Heart: RRR, normal s1, s2, no murmurs Lungs bronchial breath sounds, no rales, wheezes, or rhonchi Ext: no edema  Assessment: Encounter Diagnoses  Name Primary?  . Bronchitis Yes  . Tobacco use disorder    Plan: bronchitis - begin zpak, c/t mucinex, rest, hydrate well, recheck if not improving by later this week  Tobacco use - Patient was encouraged to quit smoking.  Discussed risks of smoking.  Discussed available resources, including free counseling through [place] [facility] line.  Begin nicotine patches.  Recheck 6mo.

## 2013-01-05 NOTE — Patient Instructions (Signed)
YOU CAN QUIT SMOKING!  Talk to your medical provider about using medicines to help you quit. These include nicotine replacement gum, lozenges, or skin patches.  Consider calling 1-800-QUIT-NOW, a toll free 24/7 hotline with free counseling to help you quit.  If you are ready to quit smoking or are thinking about it, congratulations! You have chosen to help yourself be healthier and live longer! There are lots of different ways to quit smoking. Nicotine gum, nicotine patches, a nicotine inhaler, or nicotine nasal spray can help with physical craving. Hypnosis, support groups, and medicines help break the habit of smoking. TIPS TO GET OFF AND STAY OFF CIGARETTES  Learn to predict your moods. Do not let a bad situation be your excuse to have a cigarette. Some situations in your life might tempt you to have a cigarette.   Ask friends and co-workers not to smoke around you.   Make your home smoke-free.   Never have "just one" cigarette. It leads to wanting another and another. Remind yourself of your decision to quit.   On a card, make a list of your reasons for not smoking. Read it at least the same number of times a day as you have a cigarette. Tell yourself everyday, "I do not want to smoke. I choose not to smoke."   Ask someone at home or work to help you with your plan to quit smoking.   Have something planned after you eat or have a cup of coffee. Take a walk or get other exercise to perk you up. This will help to keep you from overeating.   Try a relaxation exercise to calm you down and decrease your stress. Remember, you may be tense and nervous the first two weeks after you quit. This will pass.   Find new activities to keep your hands busy. Play with a pen, coin, or rubber band. Doodle or draw things on paper.   Brush your teeth right after eating. This will help cut down the craving for the taste of tobacco after meals. You can try mouthwash too.   Try gum, breath mints, or diet  candy to keep something in your mouth.  IF YOU SMOKE AND WANT TO QUIT:  Do not stock up on cigarettes. Never buy a carton. Wait until one pack is finished before you buy another.   Never carry cigarettes with you at work or at home.   Keep cigarettes as far away from you as possible. Leave them with someone else.   Never carry matches or a lighter with you.   Ask yourself, "Do I need this cigarette or is this just a reflex?"   Bet with someone that you can quit. Put cigarette money in a piggy bank every morning. If you smoke, you give up the money. If you do not smoke, by the end of the week, you keep the money.   Keep trying. It takes 21 days to change a habit!  Document Released: 10/06/2009 Document Revised: 08/22/2011 Document Reviewed: 10/06/2009 [employer] Patient Information 2012 [employer].   

## 2013-02-09 ENCOUNTER — Ambulatory Visit: Payer: BC Managed Care – PPO | Admitting: Medical

## 2013-03-19 ENCOUNTER — Ambulatory Visit (INDEPENDENT_AMBULATORY_CARE_PROVIDER_SITE_OTHER): Payer: Federal, State, Local not specified - PPO | Admitting: Family Medicine

## 2013-03-19 ENCOUNTER — Encounter: Payer: Self-pay | Admitting: Family Medicine

## 2013-03-19 VITALS — BP 140/100 | HR 80 | Wt 256.0 lb

## 2013-03-19 DIAGNOSIS — R209 Unspecified disturbances of skin sensation: Secondary | ICD-10-CM

## 2013-03-19 DIAGNOSIS — R200 Anesthesia of skin: Secondary | ICD-10-CM

## 2013-03-19 NOTE — Patient Instructions (Addendum)
Take the ibuprofen 3 times per day. Keep track of your symptoms and see if you can associate this with anything or not. If anything changes do not hesitate to call me even if it's after hours

## 2013-03-19 NOTE — Progress Notes (Signed)
  Subjective:    Patient ID: Jay Delacruz, male    DOB: 1956/09/16, 57 y.o.   MRN: [id number]  HPI Noted left arm tightness yesterday but no SOB, weakness, diaphoresis. The arm tightness continues he is now having slight feeling of lightheadedness. He has been able to work and has noted no change with physical activity. He also notes a tingling sensation in the upper arm. He continues to work and has not noticed any change in his strength. He did try one ibuprofen800 mg.He has no neck pain.   Review of Systems     Objective:   Physical Exam Alert and in no distress. Full motion of the shoulder. Normal motor, sensory and DTRs. Cardiac exam shows regular rhythm without murmurs or gallops. Lungs are clear to auscultation.       Assessment & Plan:  Numbness and tingling in left arm - Plan: EKG 12-Lead Take the ibuprofen 3 times per day. Keep track of your symptoms and see if you can associate this with anything or not. If anything changes do not hesitate to call me even if it's after hours

## 2013-04-15 ENCOUNTER — Telehealth: Payer: Self-pay | Admitting: Family Medicine

## 2013-04-15 NOTE — Telephone Encounter (Signed)
Needs an appt

## 2013-04-16 NOTE — Telephone Encounter (Signed)
CALLED PT TO INFORM HIM HE WOULD NEED AN APPT. LEFT MESSAGE

## 2013-04-28 ENCOUNTER — Other Ambulatory Visit: Payer: Self-pay | Admitting: Family Medicine

## 2013-04-28 NOTE — Telephone Encounter (Signed)
Is this ok?

## 2013-05-09 ENCOUNTER — Other Ambulatory Visit: Payer: Self-pay | Admitting: Family Medicine

## 2013-06-12 ENCOUNTER — Ambulatory Visit (INDEPENDENT_AMBULATORY_CARE_PROVIDER_SITE_OTHER): Payer: Federal, State, Local not specified - PPO | Admitting: Family Medicine

## 2013-06-12 ENCOUNTER — Encounter: Payer: Self-pay | Admitting: Family Medicine

## 2013-06-12 VITALS — BP 128/88 | HR 87 | Temp 98.5°F

## 2013-06-12 DIAGNOSIS — R490 Dysphonia: Secondary | ICD-10-CM

## 2013-06-12 NOTE — Progress Notes (Signed)
  Subjective:    Patient ID: Jay Delacruz, male    DOB: 07-25-1956, 57 y.o.   MRN: [id number]  HPI He has a 12 month history of difficulty with hoarse voice. He has a previous history of difficulty with vocal cord polyps in the 90s . No fever, chills, cough, congestion or sore throat earache he does smoke .   Review of Systems     Objective:   Physical Exam alert and in no distress. Tympanic membranes and canals are normal. Throat is clear. Tonsils are normal. Neck is supple without adenopathy or thyromegaly. Cardiac exam shows a regular sinus rhythm without murmurs or gallops. Lungs are clear to auscultation.        Assessment & Plan:  Hoarse voice quality - Plan: Ambulatory referral to ENT

## 2013-06-15 ENCOUNTER — Other Ambulatory Visit: Payer: Self-pay | Admitting: Medical

## 2013-06-30 ENCOUNTER — Encounter (HOSPITAL_BASED_OUTPATIENT_CLINIC_OR_DEPARTMENT_OTHER): Payer: Self-pay | Admitting: *Deleted

## 2013-06-30 NOTE — Progress Notes (Signed)
Dr Susann Givens did ekg-3/14-needs bmet-to come in for that-

## 2013-07-02 ENCOUNTER — Encounter (HOSPITAL_BASED_OUTPATIENT_CLINIC_OR_DEPARTMENT_OTHER)
Admission: RE | Admit: 2013-07-02 | Discharge: 2013-07-02 | Disposition: A | Discharge status: Home / Self Care | Payer: Federal, State, Local not specified - PPO | Source: Ambulatory Visit | Attending: Otolaryngology | Admitting: Otolaryngology

## 2013-07-02 LAB — BASIC METABOLIC PANEL
Chloride: 97 mEq/L (ref 96–112)
Creatinine, Ser: 0.84 mg/dL (ref 0.50–1.35)
GFR calc Af Amer: >90 mL/min (ref >90)

## 2013-07-02 NOTE — H&P (Signed)
PREOPERATIVE H&P  Chief Complaint: hoarseness  HPI: Jay Delacruz is a 57 y.o. male who presents for evaluation of hoarseness for several months. FOL demonstrates a right vocal cord polyp. He's taken to the OR for DL and removal of VC polyp. Does smoke about a pack per day.  Past Medical History  Diagnosis Date  . Obesity   . Hemorrhoids   . Hypertension   . ED (erectile dysfunction)   . Gout   . Atopic dermatitis   . Hyperlipidemia   . Complication of anesthesia     loose tooth bottom front  . Wears glasses    Past Surgical History  Procedure Laterality Date  . Dorsal compartment release  2002    lt wrist  . Tonsillectomy    . Arthroscopic repair acl  1995    left knee  . Patella reconstruction  96,01    right and left knees  . Laryngoscopy  1990    vocal cord polyp   History   Social History  . Marital Status: Married    Spouse Name: N/A    Number of Children: N/A  . Years of Education: N/A   Social History Main Topics  . Smoking status: Current Every Day Smoker -- 1.50 packs/day    Types: Cigarettes  . Smokeless tobacco: Never Used  . Alcohol Use: 7.2 oz/week    12 Cans of beer per week     Comment: occ  . Drug Use: 2.00 per week    Special: Marijuana  . Sexually Active: Yes   Other Topics Concern  . None   Social History Narrative  . None   Family History  Problem Relation Age of Onset  . Cancer Mother 84    Pancreatic cancer  . Cancer Father 49    Colon cancer   Allergies  Allergen Reactions  . Lisinopril Swelling    SWELLING TO FACE AND TONGUE   Prior to Admission medications   Medication Sig Start Date End Date Taking? Authorizing Provider  allopurinol (ZYLOPRIM) 300 MG tablet TAKE 1 TABLET BY MOUTH EVERY DAY 05/09/13   Ronnald Nian, MD  atorvastatin (LIPITOR) 40 MG tablet TAKE 1 TABLET EVERY DAY 11/23/12   Ronnald Nian, MD  CIALIS 20 MG tablet TAKE 1 TABLET BY MOUTH ONCE A DAY AS NEEDED 11/19/12   Ronnald Nian, MD  colchicine 0.6  MG tablet Take 1 tablet (0.6 mg total) by mouth 2 (two) times daily. 04/21/12 04/21/13  Kermit Balo Tysinger, PA-C  [facility] NTS STEP 1 21 MG/24HR patch PLACE 1 PATCH ONTO THE SKIN DAILY. 06/15/13   Kermit Balo Tysinger, PA-C  fluocinonide cream (LIDEX) 0.05 % USE AS DIRECTED 04/28/13   Ronnald Nian, MD  losartan-hydrochlorothiazide [facility]) 100-12.5 MG per tablet TAKE 1 TABLET BY MOUTH ONCE A DAY 10/02/12   Ronnald Nian, MD  LOVAZA 1 G capsule TAKE 4 CAPSULES BY MOUTH ONCE A DAY 08/30/12   Ronnald Nian, MD     Positive ROS: hoarseness. No sore throat.  All other systems have been reviewed and were otherwise negative with the exception of those mentioned in the HPI and as above.  Physical Exam: There were no vitals filed for this visit.  General: Alert, no acute distress Oral: Normal oral mucosa and tonsils Nasal: Clear nasal passages. IDL reveals a right vocal cord polyp. Normal VC mobility Neck: No palpable adenopathy or thyroid nodules Ear: Ear canal is clear with normal appearing TMs Cardiovascular: Regular  rate and rhythm, no murmur.  Respiratory: Clear to auscultation Neurologic: Alert and oriented x 3   Assessment/Plan: HORSENESS Plan for Procedure(s): MICROLARYNGOSCOPY WITH EXCISION OF VOCAL CORD LESION   Dillard Cannon, MD 07/02/2013 4:30 PM

## 2013-07-03 ENCOUNTER — Encounter (HOSPITAL_BASED_OUTPATIENT_CLINIC_OR_DEPARTMENT_OTHER)
Admission: RE | Disposition: A | Discharge status: Home / Self Care | Payer: Self-pay | Source: Ambulatory Visit | Attending: Otolaryngology

## 2013-07-03 ENCOUNTER — Ambulatory Visit (HOSPITAL_BASED_OUTPATIENT_CLINIC_OR_DEPARTMENT_OTHER)
Admission: RE | Admit: 2013-07-03 | Discharge: 2013-07-03 | Disposition: A | Discharge status: Home / Self Care | Payer: Federal, State, Local not specified - PPO | Source: Ambulatory Visit | Attending: Otolaryngology | Admitting: Otolaryngology

## 2013-07-03 ENCOUNTER — Encounter (HOSPITAL_BASED_OUTPATIENT_CLINIC_OR_DEPARTMENT_OTHER): Payer: Self-pay | Admitting: Certified Registered"

## 2013-07-03 ENCOUNTER — Ambulatory Visit (HOSPITAL_BASED_OUTPATIENT_CLINIC_OR_DEPARTMENT_OTHER): Payer: Federal, State, Local not specified - PPO | Admitting: Certified Registered"

## 2013-07-03 ENCOUNTER — Encounter (HOSPITAL_BASED_OUTPATIENT_CLINIC_OR_DEPARTMENT_OTHER): Payer: Self-pay | Admitting: *Deleted

## 2013-07-03 DIAGNOSIS — M109 Gout, unspecified: Secondary | ICD-10-CM | POA: Insufficient documentation

## 2013-07-03 DIAGNOSIS — F17200 Nicotine dependence, unspecified, uncomplicated: Secondary | ICD-10-CM | POA: Insufficient documentation

## 2013-07-03 DIAGNOSIS — E669 Obesity, unspecified: Secondary | ICD-10-CM | POA: Insufficient documentation

## 2013-07-03 DIAGNOSIS — E785 Hyperlipidemia, unspecified: Secondary | ICD-10-CM | POA: Insufficient documentation

## 2013-07-03 DIAGNOSIS — Z6834 Body mass index (BMI) 34.0-34.9, adult: Secondary | ICD-10-CM | POA: Insufficient documentation

## 2013-07-03 DIAGNOSIS — J381 Polyp of vocal cord and larynx: Secondary | ICD-10-CM | POA: Insufficient documentation

## 2013-07-03 HISTORY — DX: Presence of spectacles and contact lenses: Z97.3

## 2013-07-03 HISTORY — DX: Adverse effect of unspecified anesthetic, initial encounter: T41.45XA

## 2013-07-03 HISTORY — DX: Other complications of anesthesia, initial encounter: T88.59XA

## 2013-07-03 HISTORY — PX: MICROLARYNGOSCOPY WITH CO2 LASER AND EXCISION OF VOCAL CORD LESION: SHX5970

## 2013-07-03 LAB — POCT HEMOGLOBIN-HEMACUE: Hemoglobin: 15.1 g/dL (ref 13.0–17.0)

## 2013-07-03 SURGERY — MICROLARYNGOSCOPY WITH CO2 LASER AND EXCISION OF VOCAL CORD LESION
Anesthesia: General | Site: Mouth | Wound class: Clean Contaminated

## 2013-07-03 MED ORDER — LIDOCAINE HCL 4 % MT SOLN
OROMUCOSAL | Status: DC | PRN
  Administered 2013-07-03: 4 mL via TOPICAL

## 2013-07-03 MED ORDER — FENTANYL CITRATE 0.05 MG/ML IJ SOLN
INTRAMUSCULAR | Status: DC | PRN
  Administered 2013-07-03: 100 ug via INTRAVENOUS

## 2013-07-03 MED ORDER — EPINEPHRINE HCL 1 MG/ML IJ SOLN
INTRAMUSCULAR | Status: DC | PRN
  Administered 2013-07-03: 1 mg

## 2013-07-03 MED ORDER — OXYCODONE HCL 5 MG PO TABS: 5.0000 mg | ORAL_TABLET | Freq: Once | ORAL | Status: DC | PRN

## 2013-07-03 MED ORDER — CEFAZOLIN SODIUM-DEXTROSE 2-3 GM-% IV SOLR
INTRAVENOUS | Status: DC | PRN
  Administered 2013-07-03: 2 g via INTRAVENOUS

## 2013-07-03 MED ORDER — SUCCINYLCHOLINE CHLORIDE 20 MG/ML IJ SOLN
INTRAMUSCULAR | Status: DC | PRN
  Administered 2013-07-03: 140 mg via INTRAVENOUS

## 2013-07-03 MED ORDER — MIDAZOLAM HCL 5 MG/5ML IJ SOLN
INTRAMUSCULAR | Status: DC | PRN
  Administered 2013-07-03 (×2): 2 mg via INTRAVENOUS

## 2013-07-03 MED ORDER — GLYCOPYRROLATE 0.2 MG/ML IJ SOLN
INTRAMUSCULAR | Status: DC | PRN
  Administered 2013-07-03: 0.2 mg via INTRAVENOUS

## 2013-07-03 MED ORDER — DEXAMETHASONE SODIUM PHOSPHATE 4 MG/ML IJ SOLN
INTRAMUSCULAR | Status: DC | PRN
  Administered 2013-07-03: 10 mg via INTRAVENOUS

## 2013-07-03 MED ORDER — PROMETHAZINE HCL 25 MG/ML IJ SOLN: 6.2500 mg | INTRAMUSCULAR | Status: DC | PRN

## 2013-07-03 MED ORDER — PROPOFOL 10 MG/ML IV BOLUS
INTRAVENOUS | Status: DC | PRN
  Administered 2013-07-03: 260 mg via INTRAVENOUS

## 2013-07-03 MED ORDER — OXYCODONE HCL 5 MG/5ML PO SOLN: 5.0000 mg | Freq: Once | ORAL | Status: DC | PRN

## 2013-07-03 MED ORDER — ONDANSETRON HCL 4 MG/2ML IJ SOLN
INTRAMUSCULAR | Status: DC | PRN
  Administered 2013-07-03: 4 mg via INTRAVENOUS

## 2013-07-03 MED ORDER — LACTATED RINGERS IV SOLN
INTRAVENOUS | Status: DC
  Administered 2013-07-03 (×2): via INTRAVENOUS

## 2013-07-03 MED ORDER — FENTANYL CITRATE 0.05 MG/ML IJ SOLN: 25.0000 ug | INTRAMUSCULAR | Status: DC | PRN

## 2013-07-03 MED ORDER — ESOMEPRAZOLE MAGNESIUM 40 MG PO CPDR: 40.0000 mg | DELAYED_RELEASE_CAPSULE | Freq: Every day | ORAL | Status: DC

## 2013-07-03 MED ORDER — LIDOCAINE HCL (CARDIAC) 20 MG/ML IV SOLN
INTRAVENOUS | Status: DC | PRN
  Administered 2013-07-03: 100 mg via INTRAVENOUS

## 2013-07-03 SURGICAL SUPPLY — 23 items
CANISTER SUCTION 1200CC (MISCELLANEOUS) ×2 IMPLANT
CLOTH BEACON ORANGE TIMEOUT ST (SAFETY) ×2 IMPLANT
DEPRESSOR TONGUE BLADE STERILE (MISCELLANEOUS) ×2 IMPLANT
FILTER 7/8 IN (FILTER) ×1 IMPLANT
GAUZE SPONGE 4X4 12PLY STRL LF (GAUZE/BANDAGES/DRESSINGS) ×4 IMPLANT
GLOVE SS BIOGEL STRL SZ 7.5 (GLOVE) ×1 IMPLANT
GLOVE SUPERSENSE BIOGEL SZ 7.5 (GLOVE) ×1
GLOVE SURG SS PI 7.0 STRL IVOR (GLOVE) ×1 IMPLANT
GOWN PREVENTION PLUS XLARGE (GOWN DISPOSABLE) ×1 IMPLANT
GUARD TEETH (MISCELLANEOUS) ×1 IMPLANT
MARKER SKIN DUAL TIP RULER LAB (MISCELLANEOUS) IMPLANT
NDL SPNL 22GX7 QUINCKE BK (NEEDLE) IMPLANT
NEEDLE SPNL 22GX7 QUINCKE BK (NEEDLE) IMPLANT
NS IRRIG 1000ML POUR BTL (IV SOLUTION) ×2 IMPLANT
PAD EYE OVAL STERILE LF (GAUZE/BANDAGES/DRESSINGS) ×2 IMPLANT
PATTIES SURGICAL .5 X3 (DISPOSABLE) ×2 IMPLANT
REDUCTION FITTING 1/4 IN (FILTER) ×1 IMPLANT
SHEET MEDIUM DRAPE 40X70 STRL (DRAPES) ×2 IMPLANT
SLEEVE SCD COMPRESS KNEE MED (MISCELLANEOUS) ×2 IMPLANT
SOLUTION BUTLER CLEAR DIP (MISCELLANEOUS) ×2 IMPLANT
SYR CONTROL 10ML LL (SYRINGE) ×1 IMPLANT
TOWEL OR 17X24 6PK STRL BLUE (TOWEL DISPOSABLE) ×2 IMPLANT
TUBE CONNECTING 20X1/4 (TUBING) ×3 IMPLANT

## 2013-07-03 NOTE — Interval H&P Note (Signed)
History and Physical Interval Note:  07/03/2013 7:31 AM  Jay Delacruz  has presented today for surgery, with the diagnosis of HORSENESS  The various methods of treatment have been discussed with the patient and family. After consideration of risks, benefits and other options for treatment, the patient has consented to  Procedure(s): MICROLARYNGOSCOPY WITH EXCISION OF VOCAL CORD LESION (N/A) as a surgical intervention .  The patient's history has been reviewed, patient examined, no change in status, stable for surgery.  I have reviewed the patient's chart and labs.  Questions were answered to the patient's satisfaction.     Xena Propst

## 2013-07-03 NOTE — Anesthesia Postprocedure Evaluation (Signed)
  Anesthesia Post-op Note  Patient: Jay Delacruz  Procedure(s) Performed: Procedure(s): MICROLARYNGOSCOPY WITH EXCISION OF VOCAL CORD LESION (N/A)  Patient Location: PACU  Anesthesia Type:General  Level of Consciousness: awake  Airway and Oxygen Therapy: Patient Spontanous Breathing  Post-op Pain: mild  Post-op Assessment: Post-op Vital signs reviewed, Patient's Cardiovascular Status Stable, Respiratory Function Stable, Patent Airway, No signs of Nausea or vomiting and Pain level controlled  Post-op Vital Signs: Reviewed and stable  Complications: No apparent anesthesia complications

## 2013-07-03 NOTE — Anesthesia Procedure Notes (Signed)
Procedure Name: Intubation Date/Time: 07/03/2013 7:44 AM Performed by: Verlan Friends Pre-anesthesia Checklist: Patient identified, Emergency Drugs available, Suction available, Patient being monitored and Timeout performed Patient Re-evaluated:Patient Re-evaluated prior to inductionOxygen Delivery Method: Circle System Utilized Preoxygenation: Pre-oxygenation with 100% oxygen Intubation Type: IV induction Ventilation: Mask ventilation without difficulty Laryngoscope Size: Miller and 3 Grade View: Grade I Tube type: Oral Tube size: 6.0 mm Number of attempts: 1 Airway Equipment and Method: stylet and oral airway Placement Confirmation: ETT inserted through vocal cords under direct vision,  positive ETCO2 and breath sounds checked- equal and bilateral Secured at: 23 cm Tube secured with: Tape Dental Injury: Teeth and Oropharynx as per pre-operative assessment  Comments: Lower anterior teeth loose, not touched during intubation.  Also a mesial incisal chip noted on tooth #9

## 2013-07-03 NOTE — Op Note (Signed)
NAME:  AIDRIC, ENDICOTT              ACCOUNT NO.:  [id number]  MEDICAL RECORD NO.:  [id number]  LOCATION:                               FACILITY:  [facility]  PHYSICIAN:  Kristine Garbe. Ezzard Standing, M.D.DATE OF BIRTH:  December 24, 1956  DATE OF PROCEDURE:  07/03/2013 DATE OF DISCHARGE:  07/03/2013                              OPERATIVE REPORT   PREOPERATIVE DIAGNOSIS:  Right vocal cord lesion with hoarseness.  POSTOPERATIVE DIAGNOSIS:  Right vocal cord lesion with hoarseness.  OPERATION PERFORMED:  The microlaryngoscopy with excisional biopsy of right vocal cord lesion.  SURGEON:  Kristine Garbe. Ezzard Standing, MD  ANESTHESIA:  General endotracheal.  COMPLICATIONS:  None.  BRIEF CLINICAL NOTE:  Mcihael Hinderman is a 57 year old gentleman.  He smokes about a pack a day.  He has had hoarseness now for several months.  On exam in the office on fiberoptic laryngoscopy, he has a anterior polypoid right vocal cord lesion.  The vocal cords had symmetrical mobility otherwise.  He is taken to operating room at this time for microlaryngoscopy and excision of right vocal cord polyp.  DESCRIPTION OF PROCEDURE:  After adequate endotracheal anesthesia, direct laryngoscopy was performed.  The base of tongue, vallecula, epiglottis were clear.  Both piriform sinuses were clear.  False cords were clear.  On examination of the true vocal cords, the patient had a polypoid lesion on the right anterior vocal cord with a small reactive nodule on the left vocal cord.  Of note, he had a little bit of leukoplakia, but it was fairly minimal on the right vocal cord.  The polyp was excised at its base with scissors and sent to Pathology. Hemostasis was obtained with cotton pledgets soaked in adrenaline.  This completed the procedure.  Photos were obtained prior to excision.  The patient was awoken from anesthesia and transferred to recovery room, postop doing well.  DISPOSITION:  Derick is discharged home later this morning on  Tylenol and Motrin p.r.n. pain, antacid, omeprazole daily for the next 3 weeks and we will have him follow up in my office in 1 week for recheck and review pathology.          ______________________________ Kristine Garbe Ezzard Standing, M.D.     CEN/MEDQ  D:  07/03/2013  T:  07/03/2013  Job:  [id number]  cc:   Eather Colas, MD

## 2013-07-03 NOTE — Transfer of Care (Signed)
Immediate Anesthesia Transfer of Care Note  Patient: Jay Delacruz  Procedure(s) Performed: Procedure(s): MICROLARYNGOSCOPY WITH EXCISION OF VOCAL CORD LESION (N/A)  Patient Location: PACU  Anesthesia Type:General  Level of Consciousness: awake, alert , oriented and patient cooperative  Airway & Oxygen Therapy: Patient Spontanous Breathing and Patient connected to face mask oxygen  Post-op Assessment: Report given to PACU RN and Post -op Vital signs reviewed and stable  Post vital signs: Reviewed and stable  Complications: No apparent anesthesia complications

## 2013-07-03 NOTE — Brief Op Note (Signed)
07/03/2013  8:11 AM  PATIENT:  Jay Delacruz  57 y.o. male  PRE-OPERATIVE DIAGNOSIS:  HORSENESS  POST-OPERATIVE DIAGNOSIS:  HORSENESS-RIGHT VOCAL CORD LESION  PROCEDURE:  Procedure(s): MICROLARYNGOSCOPY WITH EXCISION OF VOCAL CORD LESION (N/A)  SURGEON:  Surgeon(s) and Role:    * Drema Halon, MD - Primary  PHYSICIAN ASSISTANT:   ASSISTANTS: none   ANESTHESIA:   general  EBL:  Total I/O In: 200 [I.V.:200] Out: -   BLOOD ADMINISTERED:none  DRAINS: none   LOCAL MEDICATIONS USED:  NONE  SPECIMEN:  Source of Specimen:  right vocal cord   DISPOSITION OF SPECIMEN:  PATHOLOGY  COUNTS:  YES  TOURNIQUET:  * No tourniquets in log *  DICTATION: .Other Dictation: Dictation Number [id number]  PLAN OF CARE: Discharge to home after PACU  PATIENT DISPOSITION:  PACU - hemodynamically stable.   Delay start of Pharmacological VTE agent (>24hrs) due to surgical blood loss or risk of bleeding: not applicable

## 2013-07-03 NOTE — Anesthesia Preprocedure Evaluation (Signed)
Anesthesia Evaluation  Patient identified by MRN, date of birth, ID band Patient awake    Reviewed: Allergy & Precautions, H&P , NPO status , Patient's Chart, lab work & pertinent test results  Airway Mallampati: II TM Distance: >3 FB Neck ROM: Full    Dental   Pulmonary COPDCurrent Smoker,  + rhonchi         Cardiovascular hypertension, Rhythm:Regular Rate:Normal     Neuro/Psych    GI/Hepatic (+)     substance abuse  marijuana use,   Endo/Other    Renal/GU      Musculoskeletal   Abdominal (+) + obese,   Peds  Hematology   Anesthesia Other Findings   Reproductive/Obstetrics                           Anesthesia Physical Anesthesia Plan  ASA: II  Anesthesia Plan: General   Post-op Pain Management:    Induction: Intravenous  Airway Management Planned: Oral ETT  Additional Equipment:   Intra-op Plan:   Post-operative Plan: Extubation in OR  Informed Consent: I have reviewed the patients History and Physical, chart, labs and discussed the procedure including the risks, benefits and alternatives for the proposed anesthesia with the patient or authorized representative who has indicated his/her understanding and acceptance.     Plan Discussed with: CRNA and Surgeon  Anesthesia Plan Comments: (Informed of possible loss of loose bottom tooth)        Anesthesia Quick Evaluation

## 2013-07-06 ENCOUNTER — Encounter (HOSPITAL_BASED_OUTPATIENT_CLINIC_OR_DEPARTMENT_OTHER): Payer: Self-pay | Admitting: Otolaryngology

## 2013-07-18 ENCOUNTER — Other Ambulatory Visit: Payer: Self-pay | Admitting: Medical

## 2013-07-20 ENCOUNTER — Other Ambulatory Visit: Payer: Self-pay | Admitting: Medical

## 2013-07-20 MED ORDER — NICOTINE 14 MG/24HR TD PT24: 1.0000 | MEDICATED_PATCH | TRANSDERMAL | Status: DC

## 2013-07-20 NOTE — Telephone Encounter (Signed)
Is this okay?

## 2013-07-23 ENCOUNTER — Other Ambulatory Visit: Payer: Self-pay | Admitting: Family Medicine

## 2013-08-31 ENCOUNTER — Encounter: Payer: Self-pay | Admitting: Family Medicine

## 2013-08-31 ENCOUNTER — Ambulatory Visit (INDEPENDENT_AMBULATORY_CARE_PROVIDER_SITE_OTHER): Payer: Federal, State, Local not specified - PPO | Admitting: Family Medicine

## 2013-08-31 VITALS — BP 124/90 | HR 92 | Ht 72.0 in | Wt 248.0 lb

## 2013-08-31 DIAGNOSIS — Z0000 Encounter for general adult medical examination without abnormal findings: Secondary | ICD-10-CM

## 2013-08-31 DIAGNOSIS — K602 Anal fissure, unspecified: Secondary | ICD-10-CM

## 2013-08-31 DIAGNOSIS — E669 Obesity, unspecified: Secondary | ICD-10-CM

## 2013-08-31 DIAGNOSIS — E291 Testicular hypofunction: Secondary | ICD-10-CM

## 2013-08-31 DIAGNOSIS — E785 Hyperlipidemia, unspecified: Secondary | ICD-10-CM

## 2013-08-31 DIAGNOSIS — M109 Gout, unspecified: Secondary | ICD-10-CM

## 2013-08-31 DIAGNOSIS — N529 Male erectile dysfunction, unspecified: Secondary | ICD-10-CM

## 2013-08-31 DIAGNOSIS — I10 Essential (primary) hypertension: Secondary | ICD-10-CM

## 2013-08-31 DIAGNOSIS — Z23 Encounter for immunization: Secondary | ICD-10-CM

## 2013-08-31 LAB — CBC WITH DIFFERENTIAL/PLATELET
Basophils Absolute: 0 10*3/uL (ref 0.0–0.1)
Lymphocytes Relative: 36 % (ref 12–46)
Lymphs Abs: 1.9 10*3/uL (ref 0.7–4.0)
Neutrophils Relative %: 52 % (ref 43–77)
Platelets: 187 10*3/uL (ref 150–400)
RBC: 5.57 MIL/uL (ref 4.22–5.81)
RDW: 15.6 % — ABNORMAL HIGH (ref 11.5–15.5)
WBC: 5.4 10*3/uL (ref 4.0–10.5)

## 2013-08-31 LAB — LIPID PANEL
Cholesterol: 180 mg/dL (ref 0–200)
HDL: 34 mg/dL — ABNORMAL LOW (ref >39)
Total CHOL/HDL Ratio: 5.3 Ratio

## 2013-08-31 LAB — POCT URINALYSIS DIPSTICK
Glucose, UA: NEGATIVE
Leukocytes, UA: NEGATIVE
Nitrite, UA: NEGATIVE
Urobilinogen, UA: NEGATIVE

## 2013-08-31 MED ORDER — OMEGA-3-ACID ETHYL ESTERS 1 G PO CAPS: ORAL_CAPSULE | ORAL | Status: DC

## 2013-08-31 MED ORDER — ATORVASTATIN CALCIUM 40 MG PO TABS: ORAL_TABLET | ORAL | Status: DC

## 2013-08-31 MED ORDER — ALLOPURINOL 300 MG PO TABS: ORAL_TABLET | ORAL | Status: DC

## 2013-08-31 MED ORDER — LOSARTAN POTASSIUM-HCTZ 100-12.5 MG PO TABS: ORAL_TABLET | ORAL | Status: DC

## 2013-08-31 NOTE — Patient Instructions (Signed)
Anal Fissure, Adult  An anal fissure is a small tear or crack in the skin around the anus. Bleeding from a fissure usually stops on its own within a few minutes. However, bleeding will often reoccur with each bowel movement until the crack heals.   CAUSES    Passing large, hard stools.   Frequent diarrheal stools.   Constipation.   Inflammatory bowel disease (Crohn's disease or ulcerative colitis).   Infections.   Anal sex.  SYMPTOMS    Small amounts of blood seen on your stools, on toilet paper, or in the toilet after a bowel movement.   Rectal bleeding.   Painful bowel movements.   Itching or irritation around the anus.  DIAGNOSIS  Your caregiver will examine the anal area. An anal fissure can usually be seen with careful inspection. A rectal exam may be performed and a short tube (anoscope) may be used to examine the anal canal.  TREATMENT    You may be instructed to take fiber supplements. These supplements can soften your stool to help make bowel movements easier.   Sitz baths may be recommended to help heal the tear. Do not use soap in the sitz baths.   A medicated cream or ointment may be prescribed to lessen discomfort.  HOME CARE INSTRUCTIONS    Maintain a diet high in fruits, whole grains, and vegetables. Avoid constipating foods like bananas and dairy products.   Take sitz baths as directed by your caregiver.   Drink enough fluids to keep your urine clear or pale yellow.   Only take over-the-counter or prescription medicines for pain, discomfort, or fever as directed by your caregiver. Do not take aspirin as this may increase bleeding.   Do not use ointments containing numbing medications (anesthetics) or hydrocortisone. They could slow healing.  SEEK MEDICAL CARE IF:    Your fissure is not completely healed within 3 days.   You have further bleeding.   You have a fever.   You have diarrhea mixed with blood.   You have pain.   Your problem is getting worse rather than  better.  MAKE SURE YOU:    Understand these instructions.   Will watch your condition.   Will get help right away if you are not doing well or get worse.  Document Released: 12/10/2005 Document Revised: 03/03/2012 Document Reviewed: 05/27/2011  [employer] Patient Information 2014 [employer].

## 2013-08-31 NOTE — Progress Notes (Signed)
Subjective:    Patient ID: Jay Delacruz, male    DOB: 1956/07/06, 57 y.o.   MRN: [id number]  HPI He is here for complete examination. His main complaint today is he is noted some soiling over the last several months. Is also occasionally seen blood on his stool and some discomfort with a BM. He continues on his blood pressure medications as well as allopurinol. He sees Dr. Patsi Sears for routine testosterone injections. He smokes and is not interested in quitting. His drinking habits are unchanged.  He is in the process of getting a divorce which has been quite stressed economically and psychologically.  He is intermittently sexually active and Cialis does help. His mother died within the last year of pancreatic cancer.  Review of Systems  Constitutional: Negative.   HENT: Negative.   Eyes: Negative.   Respiratory: Negative.   Cardiovascular: Negative.   Gastrointestinal: Negative.   Endocrine: Negative.   Genitourinary: Negative.   Musculoskeletal: Negative.   Allergic/Immunologic: Negative.   Neurological: Negative.   Hematological: Negative.        Objective:   Physical Exam BP 124/90  Pulse 92  Ht 6' (1.829 m)  Wt 248 lb (112.492 kg)  BMI 33.63 kg/m2  General Appearance:    Alert, cooperative, no distress, appears stated age  Head:    Normocephalic, without obvious abnormality, atraumatic  Eyes:    PERRL, conjunctiva/corneas clear, EOM's intact, fundi    benign  Ears:    Normal TM's and external ear canals  Nose:   Nares normal, mucosa normal, no drainage or sinus   tenderness  Throat:   Lips, mucosa, and tongue normal; teeth and gums normal  Neck:   Supple, no lymphadenopathy;  thyroid:  no   enlargement/tenderness/nodules; no carotid   bruit or JVD  Back:    Spine nontender, no curvature, ROM normal, no CVA     tenderness  Lungs:     Clear to auscultation bilaterally without wheezes, rales or     ronchi; respirations unlabored  Chest Wall:    No tenderness or  deformity   Heart:    Regular rate and rhythm, S1 and S2 normal, no murmur, rub   or gallop  Breast Exam:    No chest wall tenderness, masses or gynecomastia  Abdomen:     Soft, non-tender, nondistended, normoactive bowel sounds,    no masses, no hepatosplenomegaly  Genitalia:    Normal male external genitalia without lesions.  Testicles without masses.  No inguinal hernias.  Rectal:    Normal sphincter tone, no masses or tenderness; pinkish tissue noted at 6 and 12:00 position   Extremities:   No clubbing, cyanosis or edema  Pulses:   2+ and symmetric all extremities  Skin:   Skin color, texture, turgor normal, no rashes or lesions  Lymph nodes:   Cervical, supraclavicular, and axillary nodes normal  Neurologic:   CNII-XII intact, normal strength, sensation and gait; reflexes 2+ and symmetric throughout          Psych:   Normal mood, affect, hygiene and grooming.          Assessment & Plan:  Routine general medical examination at a health care facility - Plan: CBC with Differential  Hypertension - Plan: POCT Urinalysis Dipstick, CBC with Differential, losartan-hydrochlorothiazide (HYZAAR) 100-12.5 MG per tablet  Gout - Plan: Uric Acid, allopurinol (ZYLOPRIM) 300 MG tablet  Hyperlipidemia LDL goal < 100 - Plan: Lipid panel, atorvastatin (LIPITOR) 40 MG tablet, omega-3 acid ethyl  esters (LOVAZA) 1 G capsule  ED (erectile dysfunction)  Obesity (BMI 30-39.9)  Hypogonadism male  Need for prophylactic vaccination and inoculation against influenza - Plan: Flu Vaccine QUAD 36+ mos IM  Anal fissure Information given concerning the anal fissure. He continues to have difficulty with this, I will refer to GI.

## 2013-09-01 NOTE — Progress Notes (Signed)
Quick Note:  CALLED PT HOME/CELL # to give results of labs left word for word message Make sure he is taking his Lipitor and Lovaza and if he's not he needs to take it regularly and recheck this in 2 months. If he is taking it then we need to send him to the nutritionist because his triglycerides are sky high. i asked pt to call me back either way ______

## 2013-09-14 ENCOUNTER — Other Ambulatory Visit: Payer: Self-pay | Admitting: Family Medicine

## 2013-10-01 ENCOUNTER — Other Ambulatory Visit: Payer: Self-pay | Admitting: Family Medicine

## 2013-10-08 ENCOUNTER — Other Ambulatory Visit: Payer: Self-pay | Admitting: Family Medicine

## 2013-10-08 NOTE — Telephone Encounter (Signed)
IS THIS OK 

## 2013-12-03 ENCOUNTER — Encounter: Payer: Self-pay | Admitting: Family Medicine

## 2013-12-03 ENCOUNTER — Ambulatory Visit (INDEPENDENT_AMBULATORY_CARE_PROVIDER_SITE_OTHER): Payer: Federal, State, Local not specified - PPO | Admitting: Family Medicine

## 2013-12-03 VITALS — BP 140/80 | HR 77 | Wt 251.0 lb

## 2013-12-03 DIAGNOSIS — E782 Mixed hyperlipidemia: Secondary | ICD-10-CM

## 2013-12-03 DIAGNOSIS — E669 Obesity, unspecified: Secondary | ICD-10-CM

## 2013-12-03 MED ORDER — FENOFIBRATE 160 MG PO TABS: 160.0000 mg | ORAL_TABLET | Freq: Every day | ORAL | Status: DC

## 2013-12-03 NOTE — Patient Instructions (Signed)
Keep working on cutting back on fried foods. Eat more bacon broiled. Cut back on white food

## 2013-12-03 NOTE — Progress Notes (Signed)
   Subjective:    Patient ID: Jay Delacruz, male    DOB: 07/17/1956, 57 y.o.   MRN: [id number]  HPI He is here for consultation. He recently received a letter from his insurance indicating that they would no longer cover Lovaza. Review of his record indicates he has an elevated triglyceride level. Review of record indicates his weight has been relatively table over the last several years. He says that he was up over 300 pounds several years ago. He made changes in his diet and exercise regimen and has lost approximately 10 pounds in the last several years.   Review of Systems     Objective:   Physical Exam Alert and in no distress otherwise not examined       Assessment & Plan:  Hypercholesterolemia with hypertriglyceridemia - Plan: fenofibrate 160 MG tablet  discussed better coverage for his start with rice. Also encouraged him to make further dietary and exercise changes. Explained that he is at risk for becoming a diabetic. Recheck here in 2 months.

## 2013-12-22 ENCOUNTER — Ambulatory Visit (INDEPENDENT_AMBULATORY_CARE_PROVIDER_SITE_OTHER): Payer: Federal, State, Local not specified - PPO | Admitting: Family Medicine

## 2013-12-22 ENCOUNTER — Encounter: Payer: Self-pay | Admitting: Family Medicine

## 2013-12-22 VITALS — BP 140/100 | HR 80 | Wt 256.0 lb

## 2013-12-22 DIAGNOSIS — R319 Hematuria, unspecified: Secondary | ICD-10-CM

## 2013-12-22 DIAGNOSIS — E782 Mixed hyperlipidemia: Secondary | ICD-10-CM

## 2013-12-22 DIAGNOSIS — R195 Other fecal abnormalities: Secondary | ICD-10-CM

## 2013-12-22 LAB — POCT URINALYSIS DIPSTICK
Bilirubin, UA: NEGATIVE
Blood, UA: NEGATIVE
Glucose, UA: NEGATIVE
Ketones, UA: NEGATIVE
Leukocytes, UA: NEGATIVE
Nitrite, UA: NEGATIVE
pH, UA: 5

## 2013-12-22 LAB — COMPREHENSIVE METABOLIC PANEL
Albumin: 3.6 g/dL (ref 3.5–5.2)
Alkaline Phosphatase: 71 U/L (ref 39–117)
BUN: 14 mg/dL (ref 6–23)
CO2: 25 mEq/L (ref 19–32)
Calcium: 8.9 mg/dL (ref 8.4–10.5)
Chloride: 103 mEq/L (ref 96–112)
Glucose, Bld: 82 mg/dL (ref 70–99)
Potassium: 4.4 mEq/L (ref 3.5–5.3)
Sodium: 134 mEq/L — ABNORMAL LOW (ref 135–145)
Total Protein: 6.4 g/dL (ref 6.0–8.3)

## 2013-12-22 LAB — CBC WITH DIFFERENTIAL/PLATELET
Basophils Relative: 0 % (ref 0–1)
HCT: 43.6 % (ref 39.0–52.0)
Hemoglobin: 14.9 g/dL (ref 13.0–17.0)
Lymphs Abs: 1.6 10*3/uL (ref 0.7–4.0)
MCHC: 34.2 g/dL (ref 30.0–36.0)
Monocytes Absolute: 0.3 10*3/uL (ref 0.1–1.0)
Monocytes Relative: 9 % (ref 3–12)
Neutro Abs: 1.6 10*3/uL — ABNORMAL LOW (ref 1.7–7.7)
Neutrophils Relative %: 44 % (ref 43–77)
RBC: 5.35 MIL/uL (ref 4.22–5.81)

## 2013-12-22 NOTE — Progress Notes (Signed)
   Subjective:    Patient ID: Jay Delacruz, male    DOB: 15-Mar-1956, 57 y.o.   MRN: [id number]  HPI Last night while in the commode he noted blood in the toilet noted blood in his. He's had no burning, stinging, dysuria, urgency. Review his record indicates he does have an elevated lipid panel. He has made some dietary changes. Review of record also indicates he is in need a followup colonoscopy.   Review of Systems     Objective:   Physical Exam Alert and in no distress. Genital exam shows normal penis and slight atrophy of the right teste. Urine dipstick was negative. Stool was guaiac positive.       Assessment & Plan:  Hematuria - Plan: Urinalysis Dipstick, CBC with Differential, Comprehensive metabolic panel  Stool guaiac positive - Plan: Hemoccult - 1 Card (office), Ambulatory referral to Gastroenterology, CBC with Differential, Comprehensive metabolic panel  Hypercholesterolemia with hypertriglyceridemia - Plan: Lipid panel  explained that since the urine was negative and he is having no symptoms take watchful waiting approach to the hematuria I will refer him back to GI for followup on his positive stool. Also lipid panel check his triglycerides.

## 2013-12-23 LAB — LIPID PANEL
Cholesterol: 225 mg/dL — ABNORMAL HIGH (ref 0–200)
HDL: 24 mg/dL — ABNORMAL LOW (ref >39)
Triglycerides: 1767 mg/dL — ABNORMAL HIGH (ref <150)

## 2013-12-24 HISTORY — PX: COLONOSCOPY: SHX174

## 2013-12-25 ENCOUNTER — Emergency Department (HOSPITAL_COMMUNITY)
Admission: EM | Admit: 2013-12-25 | Discharge: 2013-12-25 | Disposition: A | Discharge status: Home / Self Care | Payer: Federal, State, Local not specified - PPO | Attending: Emergency Medicine | Admitting: Emergency Medicine

## 2013-12-25 ENCOUNTER — Encounter (HOSPITAL_COMMUNITY): Payer: Self-pay | Admitting: Emergency Medicine

## 2013-12-25 DIAGNOSIS — S139XXA Sprain of joints and ligaments of unspecified parts of neck, initial encounter: Secondary | ICD-10-CM | POA: Insufficient documentation

## 2013-12-25 DIAGNOSIS — M791 Myalgia, unspecified site: Secondary | ICD-10-CM

## 2013-12-25 DIAGNOSIS — Z87448 Personal history of other diseases of urinary system: Secondary | ICD-10-CM | POA: Insufficient documentation

## 2013-12-25 DIAGNOSIS — F17200 Nicotine dependence, unspecified, uncomplicated: Secondary | ICD-10-CM | POA: Insufficient documentation

## 2013-12-25 DIAGNOSIS — Y9239 Other specified sports and athletic area as the place of occurrence of the external cause: Secondary | ICD-10-CM | POA: Insufficient documentation

## 2013-12-25 DIAGNOSIS — Z791 Long term (current) use of non-steroidal anti-inflammatories (NSAID): Secondary | ICD-10-CM | POA: Insufficient documentation

## 2013-12-25 DIAGNOSIS — E785 Hyperlipidemia, unspecified: Secondary | ICD-10-CM | POA: Insufficient documentation

## 2013-12-25 DIAGNOSIS — Z872 Personal history of diseases of the skin and subcutaneous tissue: Secondary | ICD-10-CM | POA: Insufficient documentation

## 2013-12-25 DIAGNOSIS — IMO0001 Reserved for inherently not codable concepts without codable children: Secondary | ICD-10-CM | POA: Insufficient documentation

## 2013-12-25 DIAGNOSIS — S161XXA Strain of muscle, fascia and tendon at neck level, initial encounter: Secondary | ICD-10-CM

## 2013-12-25 DIAGNOSIS — Y92838 Other recreation area as the place of occurrence of the external cause: Secondary | ICD-10-CM

## 2013-12-25 DIAGNOSIS — M109 Gout, unspecified: Secondary | ICD-10-CM | POA: Insufficient documentation

## 2013-12-25 DIAGNOSIS — I10 Essential (primary) hypertension: Secondary | ICD-10-CM | POA: Insufficient documentation

## 2013-12-25 DIAGNOSIS — Z79899 Other long term (current) drug therapy: Secondary | ICD-10-CM | POA: Insufficient documentation

## 2013-12-25 DIAGNOSIS — X500XXA Overexertion from strenuous movement or load, initial encounter: Secondary | ICD-10-CM | POA: Insufficient documentation

## 2013-12-25 DIAGNOSIS — X503XXA Overexertion from repetitive movements, initial encounter: Secondary | ICD-10-CM | POA: Insufficient documentation

## 2013-12-25 DIAGNOSIS — E669 Obesity, unspecified: Secondary | ICD-10-CM | POA: Insufficient documentation

## 2013-12-25 DIAGNOSIS — Y9389 Activity, other specified: Secondary | ICD-10-CM | POA: Insufficient documentation

## 2013-12-25 LAB — POCT I-STAT TROPONIN I: TROPONIN I, POC: 0.01 ng/mL (ref 0.00–0.08)

## 2013-12-25 MED ORDER — HYDROCODONE-ACETAMINOPHEN 5-325 MG PO TABS: 2.0000 | ORAL_TABLET | ORAL | Status: DC | PRN

## 2013-12-25 MED ORDER — METHOCARBAMOL 500 MG PO TABS: 500.0000 mg | ORAL_TABLET | Freq: Three times a day (TID) | ORAL | Status: DC | PRN

## 2013-12-25 MED ORDER — NAPROXEN 500 MG PO TABS: 500.0000 mg | ORAL_TABLET | Freq: Two times a day (BID) | ORAL | Status: DC

## 2013-12-25 NOTE — ED Notes (Signed)
Pt c/o pain to L arm/shoulder since Sunday. Pt states he was lying on the wet ground and putting a swing set together and may have injured L arm that way. Pt has taken Motrin, but it is not helping. Pt denies chest pain, shortness of breath, nausea. Pt alert, no acute distress. Skin warm and dry.

## 2013-12-25 NOTE — Discharge Instructions (Signed)
Musculoskeletal Pain °Musculoskeletal pain is muscle and boney aches and pains. These pains can occur in any part of the body. Your caregiver may treat you without knowing the cause of the pain. They may treat you if blood or urine tests, X-rays, and other tests were normal.  °CAUSES °There is often not a definite cause or reason for these pains. These pains may be caused by a type of germ (virus). The discomfort may also come from overuse. Overuse includes working out too hard when your body is not fit. Boney aches also come from weather changes. Bone is sensitive to atmospheric pressure changes. °HOME CARE INSTRUCTIONS  °· Ask when your test results will be ready. Make sure you get your test results. °· Only take over-the-counter or prescription medicines for pain, discomfort, or fever as directed by your caregiver. If you were given medications for your condition, do not drive, operate machinery or power tools, or sign legal documents for 24 hours. Do not drink alcohol. Do not take sleeping pills or other medications that may interfere with treatment. °· Continue all activities unless the activities cause more pain. When the pain lessens, slowly resume normal activities. Gradually increase the intensity and duration of the activities or exercise. °· During periods of severe pain, bed rest may be helpful. Lay or sit in any position that is comfortable. °· Putting ice on the injured area. °· Put ice in a bag. °· Place a towel between your skin and the bag. °· Leave the ice on for 15 to 20 minutes, 3 to 4 times a day. °· Follow up with your caregiver for continued problems and no reason can be found for the pain. If the pain becomes worse or does not go away, it may be necessary to repeat tests or do additional testing. Your caregiver may need to look further for a possible cause. °SEEK IMMEDIATE MEDICAL CARE IF: °· You have pain that is getting worse and is not relieved by medications. °· You develop chest pain  that is associated with shortness or breath, sweating, feeling sick to your stomach (nauseous), or throw up (vomit). °· Your pain becomes localized to the abdomen. °· You develop any new symptoms that seem different or that concern you. °MAKE SURE YOU:  °· Understand these instructions. °· Will watch your condition. °· Will get help right away if you are not doing well or get worse. °Document Released: 12/10/2005 Document Revised: 03/03/2012 Document Reviewed: 07/30/2008 °ExitCare® Patient Information ©2014 [employer]. ° °

## 2013-12-25 NOTE — ED Provider Notes (Signed)
CSN: [id number]     Arrival date & time 12/25/13  1759 History   First MD Initiated Contact with Patient 12/25/13 1913     Chief Complaint  Patient presents with  . Arm Pain    HPI  Jay Delacruz presents with 7 days of left neck and shoulder pain. He was helping a friend assemble a large children's playground area. He states was in a lot of awkward positions in laying on the ground using a ratchet. He states he had some pain in his posterior aspect of his neck base of the skull left lateral neck left upper shoulder and left deltoid area since last Friday. Today he equals one week of time. He states has been constant. He states that it aches at night. Has never been gone. He takes ibuprofen which he improves it but it does not resolve. He does not feel short of breath. His pain is nonexertional.  Past Medical History  Diagnosis Date  . Obesity   . Hemorrhoids   . Hypertension   . ED (erectile dysfunction)   . Gout   . Atopic dermatitis   . Hyperlipidemia   . Complication of anesthesia     loose tooth bottom front  . Wears glasses    Past Surgical History  Procedure Laterality Date  . Dorsal compartment release  2002    lt wrist  . Tonsillectomy    . Arthroscopic repair acl  1995    left knee  . Patella reconstruction  96,01    right and left knees  . Laryngoscopy  1990    vocal cord polyp  . Microlaryngoscopy with co2 laser and excision of vocal cord lesion N/A 07/03/2013    Procedure: MICROLARYNGOSCOPY WITH EXCISION OF VOCAL CORD LESION;  Surgeon: Rozetta Nunnery, MD;  Location: [facility];  Service: ENT;  Laterality: N/A;   Family History  Problem Relation Age of Onset  . Cancer Mother 48    Pancreatic cancer  . Cancer Father 103    Colon cancer   History  Substance Use Topics  . Smoking status: Current Every Day Smoker -- 1.50 packs/day    Types: Cigarettes  . Smokeless tobacco: Never Used  . Alcohol Use: 7.2 oz/week    12 Cans of beer per week   Comment: occ    Review of Systems  Constitutional: Negative for fever, chills, diaphoresis, appetite change and fatigue.  HENT: Negative for mouth sores, sore throat and trouble swallowing.   Eyes: Negative for visual disturbance.  Respiratory: Negative for cough, chest tightness, shortness of breath and wheezing.   Cardiovascular: Negative for chest pain.  Gastrointestinal: Negative for nausea, vomiting, abdominal pain, diarrhea and abdominal distention.  Endocrine: Negative for polydipsia, polyphagia and polyuria.  Genitourinary: Negative for dysuria, frequency and hematuria.  Musculoskeletal: Positive for arthralgias and myalgias. Negative for gait problem.  Skin: Negative for color change, pallor and rash.  Neurological: Negative for dizziness, syncope, light-headedness and headaches.  Hematological: Does not bruise/bleed easily.  Psychiatric/Behavioral: Negative for behavioral problems and confusion.    Allergies  Lisinopril  Home Medications   Current Outpatient Rx  Name  Route  Sig  Dispense  Refill  . allopurinol (ZYLOPRIM) 300 MG tablet      TAKE 1 TABLET BY MOUTH EVERY DAY   30 tablet   PRN   . atorvastatin (LIPITOR) 40 MG tablet      TAKE 1 TABLET BY MOUTH DAILY   30 tablet   5   .  fluocinonide cream (LIDEX) 0.05 %      USE AS DIRECTED   60 g   2   . losartan-hydrochlorothiazide (HYZAAR) 100-12.5 MG per tablet      TAKE 1 TABLET BY MOUTH ONCE A DAY   30 tablet   11   . omega-3 acid ethyl esters (LOVAZA) 1 G capsule   Oral   Take 4 g by mouth daily.         . fenofibrate 160 MG tablet   Oral   Take 1 tablet (160 mg total) by mouth daily.   90 tablet   3   . HYDROcodone-acetaminophen (NORCO/VICODIN) 5-325 MG per tablet   Oral   Take 2 tablets by mouth every 4 (four) hours as needed.   10 tablet   0   . methocarbamol (ROBAXIN) 500 MG tablet   Oral   Take 1 tablet (500 mg total) by mouth 3 (three) times daily between meals as needed.   20  tablet   0   . naproxen (NAPROSYN) 500 MG tablet   Oral   Take 1 tablet (500 mg total) by mouth 2 (two) times daily.   30 tablet   0    BP 158/99  Pulse 71  Temp(Src) 98.4 F (36.9 C) (Oral)  Resp 17  SpO2 98% Physical Exam  Constitutional: He is oriented to person, place, and time. He appears well-developed and well-nourished. No distress.  HENT:  Head: Normocephalic.  Eyes: Conjunctivae are normal. Pupils are equal, round, and reactive to light. No scleral icterus.  Neck: Normal range of motion. Neck supple. No thyromegaly present.    Cardiovascular: Normal rate and regular rhythm.  Exam reveals no gallop and no friction rub.   No murmur heard. Pulmonary/Chest: Effort normal and breath sounds normal. No respiratory distress. He has no wheezes. He has no rales.  Abdominal: Soft. Bowel sounds are normal. He exhibits no distension. There is no tenderness. There is no rebound.  Musculoskeletal: Normal range of motion.       Arms: Neurological: He is alert and oriented to person, place, and time.  Skin: Skin is warm and dry. No rash noted.  Psychiatric: He has a normal mood and affect. His behavior is normal.    ED Course  Procedures (including critical care time) Labs Review Labs Reviewed  POCT I-STAT TROPONIN I   Imaging Review No results found.  EKG Interpretation    Date/Time:  Friday December 25 2013 18:51:13 EST Ventricular Rate:  68 PR Interval:  159 QRS Duration: 93 QT Interval:  400 QTC Calculation: 425 R Axis:   -48 Text Interpretation:  Sinus rhythm Left ventricular hypertrophy Anterior Q waves, possibly due to LVH Borderline T abnormalities, inferior leads Baseline wander in lead(s) V2 V6 Confirmed by Jeneen Rinks  MD, [place] ([id number]) on 12/25/2013 8:17:01 PM            MDM   1. Muscular pain   2. Cervical strain, initial encounter    His EKG is abnormal. Symptoms mostly consistent with LVH. His lateral limb leads show scooped appearance of the ST  segment, but the segments do touch the baseline. He has inverted T waves in III and F. His pain has been constant for 7 days, sounds in no way anginal, and he has a normal troponin.  He hasn't explain the mechanism for his discomfort. I think is appropriate for treatment for muscular and cervical strain with anti-inflammatories, pain medication, and muscle relaxants. Return if any worsening  of symptoms.) Recommend followup if failure to improve   Tanna Furry, MD 12/25/13 2017

## 2014-02-02 ENCOUNTER — Ambulatory Visit: Payer: Federal, State, Local not specified - PPO | Admitting: Family Medicine

## 2014-02-05 LAB — HM COLONOSCOPY

## 2014-02-17 ENCOUNTER — Encounter: Payer: Self-pay | Admitting: Family Medicine

## 2014-02-18 ENCOUNTER — Encounter: Payer: Self-pay | Admitting: Internal Medicine

## 2014-03-04 ENCOUNTER — Ambulatory Visit (INDEPENDENT_AMBULATORY_CARE_PROVIDER_SITE_OTHER): Payer: Federal, State, Local not specified - PPO | Admitting: Family Medicine

## 2014-03-04 VITALS — BP 112/88 | HR 72 | Wt 260.0 lb

## 2014-03-04 DIAGNOSIS — Z79899 Other long term (current) drug therapy: Secondary | ICD-10-CM

## 2014-03-04 DIAGNOSIS — E782 Mixed hyperlipidemia: Secondary | ICD-10-CM

## 2014-03-04 LAB — LIPID PANEL
Cholesterol: 180 mg/dL (ref 0–200)
HDL: 29 mg/dL — AB (ref >39)
Total CHOL/HDL Ratio: 6.2 Ratio
Triglycerides: 948 mg/dL — ABNORMAL HIGH (ref <150)

## 2014-03-04 NOTE — Progress Notes (Signed)
   Subjective:    Patient ID: Jay Delacruz, male    DOB: October 30, 1956, 58 y.o.   MRN: [id number]  HPI In late December he was noted to have an elevated triglyceride level. Since then he has increased his physical activity, cut back on carbohydrates as well as fried foods.   Review of Systems     Objective:   Physical Exam Alert and in no distress otherwise not examined       Assessment & Plan:  Hypercholesterolemia with hypertriglyceridemia - Plan: Lipid panel  Encounter for long-term (current) use of other medications - Plan: Lipid panel

## 2014-03-05 MED ORDER — FENOFIBRATE 160 MG PO TABS: 160.0000 mg | ORAL_TABLET | Freq: Every day | ORAL | Status: DC

## 2014-03-05 NOTE — Addendum Note (Signed)
Addended by: Denita Lung on: 03/05/2014 12:01 PM   Modules accepted: Orders

## 2014-03-05 NOTE — Progress Notes (Signed)
   Subjective:    Patient ID: Jay Delacruz, male    DOB: 1956-10-03, 58 y.o.   MRN: [id number]  HPI    Review of Systems     Objective:   Physical Exam        Assessment & Plan:  His triglycerides are still quite elevated. I will add fenofibrate to his regimen. He is to have this rechecked in 2 months.

## 2014-04-25 ENCOUNTER — Other Ambulatory Visit: Payer: Self-pay | Admitting: Family Medicine

## 2014-06-24 ENCOUNTER — Other Ambulatory Visit: Payer: Self-pay | Admitting: Family Medicine

## 2014-07-03 ENCOUNTER — Other Ambulatory Visit: Payer: Self-pay | Admitting: Family Medicine

## 2014-07-05 ENCOUNTER — Other Ambulatory Visit: Payer: Self-pay

## 2014-07-05 ENCOUNTER — Telehealth: Payer: Self-pay | Admitting: Internal Medicine

## 2014-07-05 MED ORDER — FLUOCINONIDE 0.05 % EX CREA: TOPICAL_CREAM | Freq: Two times a day (BID) | CUTANEOUS | Status: DC

## 2014-07-05 NOTE — Telephone Encounter (Signed)
Use sparingly twice a day when necessary

## 2014-07-05 NOTE — Telephone Encounter (Signed)
Resent cream with directions

## 2014-07-05 NOTE — Telephone Encounter (Signed)
Pharmacy called and wanted to know how the pt is to use the lidex 0.05% cream that was sent in this morning. Please advise and resent in with the directions

## 2014-08-12 ENCOUNTER — Other Ambulatory Visit: Payer: Self-pay

## 2014-08-12 ENCOUNTER — Telehealth: Payer: Self-pay | Admitting: Internal Medicine

## 2014-08-12 MED ORDER — LOSARTAN POTASSIUM-HCTZ 100-12.5 MG PO TABS: ORAL_TABLET | ORAL | Status: DC

## 2014-08-12 MED ORDER — OMEGA-3-ACID ETHYL ESTERS 1 G PO CAPS: 4.0000 g | ORAL_CAPSULE | Freq: Every day | ORAL | Status: DC

## 2014-08-12 MED ORDER — ALLOPURINOL 300 MG PO TABS: ORAL_TABLET | ORAL | Status: DC

## 2014-08-12 MED ORDER — ATORVASTATIN CALCIUM 40 MG PO TABS: ORAL_TABLET | ORAL | Status: DC

## 2014-08-12 NOTE — Telephone Encounter (Signed)
SENT IN MEDS

## 2014-08-12 NOTE — Telephone Encounter (Signed)
Refill request for allopurinol 300mg  #90, losartan-hctz 100-12.5mg  #90,omega-3 #90, atorvastatin 40mg  #90 to [address]

## 2014-08-23 ENCOUNTER — Ambulatory Visit (INDEPENDENT_AMBULATORY_CARE_PROVIDER_SITE_OTHER): Payer: Federal, State, Local not specified - PPO | Admitting: Family Medicine

## 2014-08-23 ENCOUNTER — Ambulatory Visit
Admission: RE | Admit: 2014-08-23 | Discharge: 2014-08-23 | Disposition: A | Discharge status: Home / Self Care | Payer: Federal, State, Local not specified - PPO | Source: Ambulatory Visit | Attending: Family Medicine | Admitting: Family Medicine

## 2014-08-23 ENCOUNTER — Encounter: Payer: Self-pay | Admitting: Family Medicine

## 2014-08-23 VITALS — BP 130/80 | HR 84 | Wt 235.0 lb

## 2014-08-23 DIAGNOSIS — M109 Gout, unspecified: Secondary | ICD-10-CM

## 2014-08-23 DIAGNOSIS — Z23 Encounter for immunization: Secondary | ICD-10-CM

## 2014-08-23 DIAGNOSIS — E785 Hyperlipidemia, unspecified: Secondary | ICD-10-CM

## 2014-08-23 NOTE — Progress Notes (Signed)
   Subjective:    Patient ID: Jay Delacruz, male    DOB: 1956/03/28, 58 y.o.   MRN: [id number]  HPI He is here for evaluation of left great toe pain. He does have a previous history of gout. He did bring an FMLA form to be filled out. Review his record indicates he does have one on file. He states that this year he did miss several days due to the gout. He continues on allopurinol. During the attack he apparently did use colchicine. Review of the record also indicates he is now taking atorvastatin. He did have a greatly elevated triglycerides.   Review of Systems     Objective:   Physical Exam Exam of both feet shows no swelling or deformity of any toes.       Assessment & Plan:  Gout of left foot, unspecified cause, unspecified chronicity - Plan: Uric Acid, DG Toe Great Left  Need for prophylactic vaccination and inoculation against influenza - Plan: Flu Vaccine QUAD 36+ mos IM  Hyperlipidemia with target LDL less than 100 - Plan: Lipid panel

## 2014-08-24 LAB — LIPID PANEL
Cholesterol: 189 mg/dL (ref 0–200)
HDL: 32 mg/dL — ABNORMAL LOW (ref >39)
TRIGLYCERIDES: 883 mg/dL — AB (ref <150)
Total CHOL/HDL Ratio: 5.9 Ratio

## 2014-08-24 LAB — URIC ACID: Uric Acid, Serum: 4.7 mg/dL (ref 4.0–7.8)

## 2014-08-24 MED ORDER — FENOFIBRATE 160 MG PO TABS: 160.0000 mg | ORAL_TABLET | Freq: Every day | ORAL | Status: DC

## 2014-08-24 NOTE — Addendum Note (Signed)
Addended by: Denita Lung on: 08/24/2014 08:26 AM   Modules accepted: Orders

## 2014-11-08 ENCOUNTER — Other Ambulatory Visit: Payer: Self-pay | Admitting: Family Medicine

## 2014-11-11 ENCOUNTER — Other Ambulatory Visit: Payer: Self-pay | Admitting: Family Medicine

## 2014-11-12 NOTE — Telephone Encounter (Signed)
Is this okay to refill? 

## 2014-11-16 ENCOUNTER — Other Ambulatory Visit: Payer: Self-pay | Admitting: Family Medicine

## 2015-01-07 ENCOUNTER — Ambulatory Visit (INDEPENDENT_AMBULATORY_CARE_PROVIDER_SITE_OTHER): Payer: Federal, State, Local not specified - PPO | Admitting: Medical

## 2015-01-07 ENCOUNTER — Encounter: Payer: Self-pay | Admitting: Medical

## 2015-01-07 VITALS — BP 112/80 | HR 77 | Temp 97.9°F | Wt 251.0 lb

## 2015-01-07 DIAGNOSIS — J309 Allergic rhinitis, unspecified: Secondary | ICD-10-CM

## 2015-01-07 DIAGNOSIS — L0100 Impetigo, unspecified: Secondary | ICD-10-CM

## 2015-01-07 MED ORDER — AZELASTINE-FLUTICASONE 137-50 MCG/ACT NA SUSP: 1.0000 | Freq: Two times a day (BID) | NASAL | Status: DC

## 2015-01-07 MED ORDER — MUPIROCIN 2 % EX OINT: 1.0000 "application " | TOPICAL_OINTMENT | Freq: Three times a day (TID) | CUTANEOUS | Status: DC

## 2015-01-07 NOTE — Progress Notes (Signed)
  Subjective:  Jay Delacruz is a 59 y.o. male who presents for running nose.  Symptoms include:1wk hx/o constantly runny nose, worse L>R, sneezing, and has raw feeling in left nostril, starting to get crusting tender bumps of left skin above lip.  Otherwise, denies cough, sinus pressure, fever, sore throat, ear pain, SOB, wheezing, doesn't feel sick.   No recent spicy foods.  In the past hasn't particularly had allergy troubles this time of year, but doesn't feel sick either.   Using nothing for symptoms other than friend's sinus pill he took yesterday.   No other aggravating or relieving factors. No other complaint.  The following portions of the patient's history were reviewed and updated as appropriate: allergies, current medications, past family history, past medical history, past social history, past surgical history and problem list.  ROS as in subjective   Objective: Filed Vitals:   01/07/15 1004  BP: 112/80  Pulse: 77  Temp: 97.9 F (36.6 C)    General appearance: Alert, WD/WN, no distress                             Skin: left face above upper lip with few raise serous crusting lesions c/w impetigo, otherwise warm                           Head: no sinus tenderness                            Eyes: conjunctiva normal, corneas clear, PERRLA                            Ears: pearly TMs, external ear canals normal                          Nose: septum midline, turbinates swollen, no erythema, clear discharge                  Mouth/throat: MMM, tongue normal, no pharyngeal erythema or exudate                           Neck: supple, no adenopathy, no thyromegaly, non tender                           Lungs: CTA bilaterally, no wheezes, rales, or rhonchi   Assessment:   Encounter Diagnoses  Name Primary?  . Allergic rhinitis, unspecified allergic rhinitis type Yes  . Impetigo     Plan:    Medications: Dymista nasal spray, Mupirocin ointment. Begin Dymista spray 1 spray each  nostril BID, avoid triggers if possible, short term use the mupirocin ointment in left nostril and left upper lip where mild impetigo infection has started.  Can also use zyrtec OTC QHS.   C/t the allergy regimen for the next 3-4 week.  If worse or not improving in the next 2-3 days, then Follow-up .  otherwise f/u in 72mo

## 2015-02-04 ENCOUNTER — Other Ambulatory Visit: Payer: Self-pay | Admitting: Family Medicine

## 2015-03-18 ENCOUNTER — Other Ambulatory Visit: Payer: Self-pay | Admitting: Family Medicine

## 2015-04-29 ENCOUNTER — Other Ambulatory Visit: Payer: Self-pay | Admitting: Family Medicine

## 2015-04-29 NOTE — Telephone Encounter (Signed)
Is this okay to refill? 

## 2015-06-05 ENCOUNTER — Other Ambulatory Visit: Payer: Self-pay | Admitting: Family Medicine

## 2015-06-14 ENCOUNTER — Other Ambulatory Visit: Payer: Self-pay | Admitting: Family Medicine

## 2015-06-29 ENCOUNTER — Encounter: Payer: Self-pay | Admitting: Family Medicine

## 2015-06-29 ENCOUNTER — Ambulatory Visit (INDEPENDENT_AMBULATORY_CARE_PROVIDER_SITE_OTHER): Payer: Federal, State, Local not specified - PPO | Admitting: Family Medicine

## 2015-06-29 VITALS — BP 140/90 | HR 74 | Ht 72.5 in | Wt 258.0 lb

## 2015-06-29 DIAGNOSIS — E785 Hyperlipidemia, unspecified: Secondary | ICD-10-CM

## 2015-06-29 DIAGNOSIS — Z720 Tobacco use: Secondary | ICD-10-CM

## 2015-06-29 DIAGNOSIS — M1000 Idiopathic gout, unspecified site: Secondary | ICD-10-CM | POA: Diagnosis not present

## 2015-06-29 DIAGNOSIS — J302 Other seasonal allergic rhinitis: Secondary | ICD-10-CM | POA: Diagnosis not present

## 2015-06-29 DIAGNOSIS — E669 Obesity, unspecified: Secondary | ICD-10-CM | POA: Diagnosis not present

## 2015-06-29 DIAGNOSIS — E291 Testicular hypofunction: Secondary | ICD-10-CM | POA: Diagnosis not present

## 2015-06-29 DIAGNOSIS — I10 Essential (primary) hypertension: Secondary | ICD-10-CM

## 2015-06-29 DIAGNOSIS — F17200 Nicotine dependence, unspecified, uncomplicated: Secondary | ICD-10-CM

## 2015-06-29 DIAGNOSIS — Z0000 Encounter for general adult medical examination without abnormal findings: Secondary | ICD-10-CM | POA: Diagnosis not present

## 2015-06-29 DIAGNOSIS — N528 Other male erectile dysfunction: Secondary | ICD-10-CM

## 2015-06-29 LAB — CBC WITH DIFFERENTIAL/PLATELET
BASOS ABS: 0 10*3/uL (ref 0.0–0.1)
Basophils Relative: 0 % (ref 0–1)
EOS ABS: 0.2 10*3/uL (ref 0.0–0.7)
EOS PCT: 5 % (ref 0–5)
HCT: 43 % (ref 39.0–52.0)
Hemoglobin: 14.5 g/dL (ref 13.0–17.0)
Lymphocytes Relative: 36 % (ref 12–46)
Lymphs Abs: 1.5 10*3/uL (ref 0.7–4.0)
MCH: 27.6 pg (ref 26.0–34.0)
MCHC: 33.7 g/dL (ref 30.0–36.0)
MCV: 81.9 fL (ref 78.0–100.0)
MPV: 8.6 fL (ref 8.6–12.4)
Monocytes Absolute: 0.4 10*3/uL (ref 0.1–1.0)
Monocytes Relative: 10 % (ref 3–12)
Neutro Abs: 2.1 10*3/uL (ref 1.7–7.7)
Neutrophils Relative %: 49 % (ref 43–77)
PLATELETS: 160 10*3/uL (ref 150–400)
RBC: 5.25 MIL/uL (ref 4.22–5.81)
RDW: 15.9 % — ABNORMAL HIGH (ref 11.5–15.5)
WBC: 4.3 10*3/uL (ref 4.0–10.5)

## 2015-06-29 LAB — POCT URINALYSIS DIPSTICK
BILIRUBIN UA: NEGATIVE
Blood, UA: NEGATIVE
Glucose, UA: NEGATIVE
KETONES UA: NEGATIVE
Leukocytes, UA: NEGATIVE
Nitrite, UA: NEGATIVE
Protein, UA: NEGATIVE
Spec Grav, UA: 1.025
Urobilinogen, UA: NEGATIVE
pH, UA: 6

## 2015-06-29 LAB — COMPREHENSIVE METABOLIC PANEL
ALT: 18 U/L (ref 0–53)
AST: 28 U/L (ref 0–37)
Albumin: 3.8 g/dL (ref 3.5–5.2)
Alkaline Phosphatase: 67 U/L (ref 39–117)
BUN: 16 mg/dL (ref 6–23)
CALCIUM: 9.4 mg/dL (ref 8.4–10.5)
CHLORIDE: 105 meq/L (ref 96–112)
CO2: 22 meq/L (ref 19–32)
Creat: 0.82 mg/dL (ref 0.50–1.35)
Glucose, Bld: 100 mg/dL — ABNORMAL HIGH (ref 70–99)
Potassium: 4.3 mEq/L (ref 3.5–5.3)
Sodium: 139 mEq/L (ref 135–145)
Total Bilirubin: 0.4 mg/dL (ref 0.2–1.2)
Total Protein: 6.4 g/dL (ref 6.0–8.3)

## 2015-06-29 LAB — LIPID PANEL
CHOL/HDL RATIO: 4.6 ratio
CHOLESTEROL: 195 mg/dL (ref 0–200)
HDL: 42 mg/dL (ref >=40)
TRIGLYCERIDES: 411 mg/dL — AB (ref <150)

## 2015-06-29 LAB — URIC ACID: Uric Acid, Serum: 6.4 mg/dL (ref 4.0–7.8)

## 2015-06-29 MED ORDER — ATORVASTATIN CALCIUM 40 MG PO TABS: 40.0000 mg | ORAL_TABLET | Freq: Every day | ORAL | Status: DC

## 2015-06-29 MED ORDER — VARDENAFIL HCL 20 MG PO TABS: 20.0000 mg | ORAL_TABLET | Freq: Every day | ORAL | Status: DC | PRN

## 2015-06-29 MED ORDER — AZELASTINE-FLUTICASONE 137-50 MCG/ACT NA SUSP: 1.0000 | Freq: Two times a day (BID) | NASAL | Status: DC

## 2015-06-29 MED ORDER — ALLOPURINOL 300 MG PO TABS: 300.0000 mg | ORAL_TABLET | Freq: Every day | ORAL | Status: DC

## 2015-06-29 MED ORDER — NICOTINE 21 MG/24HR TD PT24: 21.0000 mg | MEDICATED_PATCH | Freq: Every day | TRANSDERMAL | Status: DC

## 2015-06-29 MED ORDER — FENOFIBRATE 160 MG PO TABS: 160.0000 mg | ORAL_TABLET | Freq: Every day | ORAL | Status: DC

## 2015-06-29 MED ORDER — LOSARTAN POTASSIUM-HCTZ 100-12.5 MG PO TABS: 1.0000 | ORAL_TABLET | Freq: Every day | ORAL | Status: DC

## 2015-06-29 NOTE — Progress Notes (Signed)
Subjective:    Patient ID: Jay Delacruz, male    DOB: 26-Aug-1956, 59 y.o.   MRN: [id number]  HPI He is here for complete examination. He is interested in quitting smoking. States he is now up to 2 packs per day. He would like NicoDerm so he can use his insurance. He also has a several month history of urinary urgency but no hesitancy, decreased stream, incomplete emptying. He continues on testosterone injections. He does see Dr. Gaynelle Arabian regularly for this. He also is had difficulty with erectile dysfunction and states Cialis has not been his usefulness he would like. He has tried Viagra in the past and did not like the side effects. He does have a history of gout but has had no difficulty with flares. His allergies are under good control. He continues on Lipitor without trouble. Continues on his blood pressure medication. He has had no difficulty with chest pain, shortness of breath or GI issues. He does drink 2 or 3 beers per day which is his standard. His physical activity is unchanged. He apparently is engaged to be married. His work is going well. He has no other concerns or complaints.Family and social history as well as health maintenance and immunizations were reviewed.   Review of Systems  All other systems reviewed and are negative.      Objective:   Physical Exam BP 140/90 mmHg  Pulse 74  Ht 6' 0.5" (1.842 m)  Wt 258 lb (117.028 kg)  BMI 34.49 kg/m2  SpO2 98%  General Appearance:    Alert, cooperative, no distress, appears stated age  Head:    Normocephalic, without obvious abnormality, atraumatic  Eyes:    PERRL, conjunctiva/corneas clear, EOM's intact, fundi    benign  Ears:    Normal TM's and external ear canals  Nose:   Nares normal, mucosa normal, no drainage or sinus   tenderness  Throat:   Lips, mucosa, and tongue normal; teeth and gums normal  Neck:   Supple, no lymphadenopathy;  thyroid:  no   enlargement/tenderness/nodules; no carotid   bruit or JVD  Back:     Spine nontender, no curvature, ROM normal, no CVA     tenderness  Lungs:     Clear to auscultation bilaterally without wheezes, rales or     ronchi; respirations unlabored  Chest Wall:    No tenderness or deformity   Heart:    Regular rate and rhythm, S1 and S2 normal, no murmur, rub   or gallop  Breast Exam:    No chest wall tenderness, masses or gynecomastia  Abdomen:     Soft, non-tender, nondistended, normoactive bowel sounds,    no masses, no hepatosplenomegaly        Extremities:   No clubbing, cyanosis or edema  Pulses:   2+ and symmetric all extremities  Skin:   Skin color, texture, turgor normal, no rashes or lesions  Lymph nodes:   Cervical, supraclavicular, and axillary nodes normal  Neurologic:   CNII-XII intact, normal strength, sensation and gait; reflexes 2+ and symmetric throughout          Psych:   Normal mood, affect, hygiene and grooming.          Assessment & Plan:  Routine general medical examination at a health care facility - Plan: POCT Urinalysis Dipstick, CBC with Differential/Platelet, Comprehensive metabolic panel, Lipid panel  Idiopathic gout, unspecified chronicity, unspecified site - Plan: Uric Acid, allopurinol (ZYLOPRIM) 300 MG tablet  Hyperlipidemia with target  LDL less than 100 - Plan: Lipid panel, fenofibrate 160 MG tablet, atorvastatin (LIPITOR) 40 MG tablet  Other male erectile dysfunction - Plan: vardenafil (LEVITRA) 20 MG tablet  Current smoker - Plan: nicotine (NICODERM CQ) 21 mg/24hr patch  Other seasonal allergic rhinitis - Plan: Azelastine-Fluticasone (DYMISTA) 137-50 MCG/ACT SUSP  Hypogonadism male  Essential hypertension - Plan: CBC with Differential/Platelet, Comprehensive metabolic panel, Lipid panel, losartan-hydrochlorothiazide (HYZAAR) 100-12.5 MG per tablet  Obesity (BMI 30-39.9) - Plan: CBC with Differential/Platelet, Comprehensive metabolic panel, Lipid panel I discussed smoking cessation with him in regard to habit versus  addiction. Encouraged him to make a list of when where and why he smokes and then make a list of things that he will do instead of smoking. I will also give him NicoDerm. Did not schedule him for a return appointment but will wait for him to set one up. He is comfortable with this approach. I also gave him the [facility] smoking cessation program as well as 800 quit now. Also recommend he talk to Dr. Gaynelle Arabian concerning his symptoms that sound like OAB when he goes in for the check for his testosterone.

## 2015-06-29 NOTE — Patient Instructions (Signed)
You can use the [facility] program;Also 800 quit now. Keep a record of when and where and why you smoke. When You have completed this,  Give your self  something to do. Once you start, set up an appointment with me in 2 weeks.

## 2015-07-14 ENCOUNTER — Telehealth: Payer: Self-pay

## 2015-07-14 MED ORDER — NICOTINE 14 MG/24HR TD PT24: 14.0000 mg | MEDICATED_PATCH | Freq: Every day | TRANSDERMAL | Status: DC

## 2015-07-14 NOTE — Telephone Encounter (Signed)
Let him know that I called the medication in. 

## 2015-07-14 NOTE — Telephone Encounter (Signed)
Patient called to inform you he can not come back in yet because of $30.00 copay but wanted you to know he has not smoked since July 10th his step one runs out Sat. He is ready for step two please advise

## 2015-07-16 ENCOUNTER — Other Ambulatory Visit: Payer: Self-pay | Admitting: Family Medicine

## 2015-07-17 ENCOUNTER — Other Ambulatory Visit: Payer: Self-pay | Admitting: Family Medicine

## 2015-07-18 ENCOUNTER — Other Ambulatory Visit: Payer: Self-pay

## 2015-07-18 MED ORDER — NICOTINE 21 MG/24HR TD PT24: 21.0000 mg | MEDICATED_PATCH | Freq: Every day | TRANSDERMAL | Status: DC

## 2015-07-18 MED ORDER — NICOTINE 14 MG/24HR TD PT24: 14.0000 mg | MEDICATED_PATCH | Freq: Every day | TRANSDERMAL | Status: DC

## 2015-08-01 ENCOUNTER — Other Ambulatory Visit: Payer: Self-pay

## 2015-08-01 ENCOUNTER — Telehealth: Payer: Self-pay | Admitting: Family Medicine

## 2015-08-01 MED ORDER — NICOTINE 14 MG/24HR TD PT24: 14.0000 mg | MEDICATED_PATCH | Freq: Every day | TRANSDERMAL | Status: DC

## 2015-08-01 NOTE — Telephone Encounter (Signed)
Jay Delacruz called and said it is time for his Step 2 patches ( said you would know what that meant)  Sent to [facility] on file

## 2015-08-12 ENCOUNTER — Ambulatory Visit (INDEPENDENT_AMBULATORY_CARE_PROVIDER_SITE_OTHER): Payer: Federal, State, Local not specified - PPO | Admitting: Family Medicine

## 2015-08-12 VITALS — BP 120/74 | HR 90 | Wt 257.0 lb

## 2015-08-12 DIAGNOSIS — L249 Irritant contact dermatitis, unspecified cause: Secondary | ICD-10-CM

## 2015-08-12 DIAGNOSIS — Z87891 Personal history of nicotine dependence: Secondary | ICD-10-CM | POA: Diagnosis not present

## 2015-08-12 MED ORDER — NICOTINE 7 MG/24HR TD PT24: 7.0000 mg | MEDICATED_PATCH | Freq: Every day | TRANSDERMAL | Status: DC

## 2015-08-12 NOTE — Progress Notes (Signed)
   Subjective:    Patient ID: Jay Delacruz, male    DOB: [birth year], 59 y.o.   MRN: [id number]  HPI He complains of itching in the area that he is using the NicoDerm patches. He also is now 40 days smoke-free. He is on the 14 mg patch and doing well on this. His main problem is when he is at work. He has changed his habits at work avoiding going out on a break with people that smoke.   Review of Systems     Objective:   Physical Exam Alert and in no distress exam of his arms does show some slight dryness.       Assessment & Plan:  Former smoker - Plan: nicotine (NICODERM CQ) 7 mg/24hr patch  Irritant contact dermatitis Complemented him on the good work that he is doing. He will use Lidex on the area after he takes the patch off. Also discussed the fact that he is doing a better job than he gets himself credit for since he is able to change his work habits in regard to smoking. He is to use a 7 mg patch for 2 weeks and then stop. If he has problems he is to return here.

## 2015-09-14 ENCOUNTER — Telehealth: Payer: Self-pay | Admitting: Family Medicine

## 2015-09-14 MED ORDER — OMEGA-3-ACID ETHYL ESTERS 1 G PO CAPS: 4.0000 | ORAL_CAPSULE | Freq: Every day | ORAL | Status: DC

## 2015-09-14 NOTE — Telephone Encounter (Signed)
Pt called and was wanting a refill for his omega 3 pres, and was always wanting a pres for cialis. Pt uses [facility] on [address] pt can be reached at [phone]

## 2015-09-14 NOTE — Telephone Encounter (Signed)
I gave him a sample Levitra. See if he wants that. The note said Cialis

## 2015-09-15 MED ORDER — TADALAFIL 20 MG PO TABS: 20.0000 mg | ORAL_TABLET | Freq: Every day | ORAL | Status: DC | PRN

## 2015-09-15 NOTE — Telephone Encounter (Signed)
PATIENT SAID HE PREFERS CIALIS

## 2015-09-15 NOTE — Addendum Note (Signed)
Addended by: Denita Lung on: 09/15/2015 11:05 AM   Modules accepted: Orders, Medications

## 2015-10-10 ENCOUNTER — Ambulatory Visit (INDEPENDENT_AMBULATORY_CARE_PROVIDER_SITE_OTHER): Admitting: Family Medicine

## 2015-10-10 ENCOUNTER — Encounter: Payer: Self-pay | Admitting: Family Medicine

## 2015-10-10 VITALS — BP 130/90 | HR 87 | Ht 71.0 in | Wt 259.0 lb

## 2015-10-10 DIAGNOSIS — S46911A Strain of unspecified muscle, fascia and tendon at shoulder and upper arm level, right arm, initial encounter: Secondary | ICD-10-CM

## 2015-10-10 MED ORDER — IBUPROFEN 800 MG PO TABS: 800.0000 mg | ORAL_TABLET | Freq: Three times a day (TID) | ORAL | Status: DC | PRN

## 2015-10-10 NOTE — Patient Instructions (Signed)
Do as many things as you can with your right hand. 800 mg of ibuprofen 3 times per day. He received 2 weeks

## 2015-10-11 NOTE — Progress Notes (Signed)
   Subjective:    Patient ID: Jay Delacruz, male    DOB: [birth year], 59 y.o.   MRN: [id number]  HPI The injury occurred on October 13 while he was working. He describes lifting a 14 pound object with his left arm forward and slightly abducted. He was seen in an urgent care center and evaluated but apparently no definite diagnosis was given. He states that the provider had to get a book to know how to do a range of motion evaluation. He has been able to continue to work and does not want to stop working   Review of Systems     Objective:   Physical Exam Slight pain on palpation over the anterior shoulder  area but no tenderness over the bicipital tendon. Full range of motion of the shoulder.. Supraspinatus testing was negative.       Assessment & Plan:  Shoulder strain, right, initial encounter - Plan: ibuprofen (ADVIL,MOTRIN) 800 MG tablet  recommend conservative care and he will follow-up with me in 2 weeks.

## 2015-10-24 ENCOUNTER — Encounter: Payer: Self-pay | Admitting: Family Medicine

## 2015-10-24 ENCOUNTER — Ambulatory Visit (INDEPENDENT_AMBULATORY_CARE_PROVIDER_SITE_OTHER): Admitting: Family Medicine

## 2015-10-24 VITALS — BP 116/70 | HR 78 | Ht 71.0 in | Wt 261.0 lb

## 2015-10-24 DIAGNOSIS — S46911D Strain of unspecified muscle, fascia and tendon at shoulder and upper arm level, right arm, subsequent encounter: Secondary | ICD-10-CM

## 2015-10-24 NOTE — Progress Notes (Signed)
   Subjective:    Patient ID: Jay Delacruz, male    DOB: [birth year], 59 y.o.   MRN: [id number]  HPI He is here for a recheck. He states he is now totally back to normal and having no pain in his shoulder.   Review of Systems     Objective:   Physical Exam Alert and in no distress. Full range of shoulder with no pain. No laxity noted. Negative drop arm test. Negative Neer's and Hawkins test.       Assessment & Plan:  Shoulder strain, right, subsequent encounter  he is cleared to go back to full duty

## 2015-11-07 ENCOUNTER — Ambulatory Visit (INDEPENDENT_AMBULATORY_CARE_PROVIDER_SITE_OTHER): Payer: Federal, State, Local not specified - PPO | Admitting: Family Medicine

## 2015-11-07 ENCOUNTER — Encounter: Payer: Self-pay | Admitting: Family Medicine

## 2015-11-07 VITALS — BP 140/84 | HR 72 | Temp 98.1°F | Wt 268.8 lb

## 2015-11-07 DIAGNOSIS — S61219A Laceration without foreign body of unspecified finger without damage to nail, initial encounter: Secondary | ICD-10-CM

## 2015-11-07 DIAGNOSIS — Z23 Encounter for immunization: Secondary | ICD-10-CM | POA: Diagnosis not present

## 2015-11-07 NOTE — Progress Notes (Signed)
   Subjective:    Patient ID: Jay Delacruz, male    DOB: March 25, 1956, 59 y.o.   MRN: [id number]  HPI Chief Complaint  Patient presents with  . cut finger    cut finger last week cutting wild game with a knife. he cleaned it when it happen but swollen now and started over weekend. painful, hurts to bend   He is here with a laceration to his left middle finger at the PIP joint on palmar side of his left hand that occurred approximately one week ago while using a filet knife cutting groundhog and raccoon. He reports redness, swelling and pain to left middle finger and extending into the palm of his left hand for past 3 days. He denies fever, chills, nausea, vomiting. Denies drainage or bleeding from wound.  He does not have diabetes, he is immunocompetent.   Reviewed allergies, medications, past medical history, and social history.   Review of Systems Pertinent positives and negatives in the history of present illness.    Objective:   Physical Exam  Alert and oriented and in no distress.  Ext: Laceration to palmar aspect of 3 rd digit on left hand at the PIP joint. Significant erythema and edema, marked tenderness to PIP and MCP of left hand, tenderness extends into palm of hand. Capillary refill and sensation normal. Limited ROM. No drainage.      Assessment & Plan:  Laceration of finger without foreign body without damage to nail, initial encounter - Plan: Td : Tetanus/diphtheria >7yo Preservative  free  Dr. Redmond School also examined patient. Updated Td due to >6 years from previous. Awaiting return call from hand surgeon, Dr. Fredna Dow office at [facility]. Waited for approximately 45 minutes and did not receive call back. CMA Sabrina called Hand center back and their recommendation was to send patient to the emergency department, they were unable to see him. Dr. Redmond School spoke with nurse at hand center. Will attempt to get him in with a different hand specialist since he been assessed by Dr.  Redmond School and highly suspect patient will need surgery. Dr. Caralyn Guile office will see patient today at 1pm.

## 2015-11-07 NOTE — Patient Instructions (Signed)

## 2016-01-30 ENCOUNTER — Encounter: Payer: Self-pay | Admitting: Family Medicine

## 2016-01-30 ENCOUNTER — Ambulatory Visit (INDEPENDENT_AMBULATORY_CARE_PROVIDER_SITE_OTHER): Payer: Federal, State, Local not specified - PPO | Admitting: Family Medicine

## 2016-01-30 ENCOUNTER — Other Ambulatory Visit: Payer: Self-pay | Admitting: Family Medicine

## 2016-01-30 VITALS — BP 150/92 | HR 99 | Wt 288.0 lb

## 2016-01-30 DIAGNOSIS — R609 Edema, unspecified: Secondary | ICD-10-CM | POA: Diagnosis not present

## 2016-01-30 LAB — CBC WITH DIFFERENTIAL/PLATELET
BASOS ABS: 0 10*3/uL (ref 0.0–0.1)
Basophils Relative: 1 % (ref 0–1)
EOS PCT: 2 % (ref 0–5)
Eosinophils Absolute: 0.1 10*3/uL (ref 0.0–0.7)
HEMATOCRIT: 38.1 % — AB (ref 39.0–52.0)
HEMOGLOBIN: 12.6 g/dL — AB (ref 13.0–17.0)
Lymphocytes Relative: 35 % (ref 12–46)
Lymphs Abs: 1.5 10*3/uL (ref 0.7–4.0)
MCH: 27.5 pg (ref 26.0–34.0)
MCHC: 33.1 g/dL (ref 30.0–36.0)
MCV: 83 fL (ref 78.0–100.0)
MPV: 8.8 fL (ref 8.6–12.4)
Monocytes Absolute: 0.4 10*3/uL (ref 0.1–1.0)
Monocytes Relative: 10 % (ref 3–12)
NEUTROS ABS: 2.2 10*3/uL (ref 1.7–7.7)
Neutrophils Relative %: 52 % (ref 43–77)
Platelets: 160 10*3/uL (ref 150–400)
RBC: 4.59 MIL/uL (ref 4.22–5.81)
RDW: 17.4 % — AB (ref 11.5–15.5)
WBC: 4.3 10*3/uL (ref 4.0–10.5)

## 2016-01-30 LAB — COMPREHENSIVE METABOLIC PANEL
ALK PHOS: 84 U/L (ref 40–115)
ALT: 52 U/L — AB (ref 9–46)
AST: 46 U/L — AB (ref 10–35)
Albumin: 3.6 g/dL (ref 3.6–5.1)
BUN: 13 mg/dL (ref 7–25)
CALCIUM: 9.4 mg/dL (ref 8.6–10.3)
CO2: 25 mmol/L (ref 20–31)
Chloride: 102 mmol/L (ref 98–110)
Creat: 0.84 mg/dL (ref 0.70–1.33)
Glucose, Bld: 94 mg/dL (ref 65–99)
Potassium: 3.8 mmol/L (ref 3.5–5.3)
Sodium: 138 mmol/L (ref 135–146)
Total Bilirubin: 0.4 mg/dL (ref 0.2–1.2)
Total Protein: 6.4 g/dL (ref 6.1–8.1)

## 2016-01-30 NOTE — Progress Notes (Signed)
   Subjective:    Patient ID: Jay Delacruz, male    DOB: 1956/10/30, 60 y.o.   MRN: [id number]  HPI He complains of a 4 day history of lower extremity swelling. He has noted no associated shortness of breath, chest pain, DOE, orthopnea,change in urine or swelling anywhere else. The swelling does tend to go away when he gets up in the morning.   Review of Systems     Objective:   Physical Exam Alert and in no distress. Cardiac exam shows a regular rhythm without murmurs or gallops lungs are clear to auscultation. Lower extremities showed good pulses with normal skin.1 to 2+ pitting is noted.       Assessment & Plan:  Peripheral edema - Plan: CBC with Differential/Platelet, Comprehensive metabolic panel I suspect this is dependent edema and probably there much longer than he realized. Explained that we needed to make sure that there were no cardiac or renal manifestations. We'll discuss this further after blood work.

## 2016-01-31 LAB — HEPATITIS C ANTIBODY: HCV AB: NEGATIVE

## 2016-03-23 ENCOUNTER — Telehealth: Payer: Self-pay | Admitting: Family Medicine

## 2016-03-23 NOTE — Telephone Encounter (Signed)
Pt wants to know what Dr Redmond School think about Plavinol

## 2016-03-23 NOTE — Telephone Encounter (Signed)
There is no such thing. Find out the correct spelling

## 2016-03-26 NOTE — Telephone Encounter (Signed)
Pt states its Pravachol. He saw it on the tv and thought he might benefit from it

## 2016-03-26 NOTE — Telephone Encounter (Signed)
Tell him that he is on Lipitor which is a more potent medication than Pravachol

## 2016-03-26 NOTE — Telephone Encounter (Signed)
Pt is aware.  

## 2016-05-04 ENCOUNTER — Other Ambulatory Visit: Payer: Self-pay | Admitting: Medical

## 2016-05-04 ENCOUNTER — Telehealth: Payer: Self-pay | Admitting: Medical

## 2016-05-04 MED ORDER — INDOMETHACIN 50 MG PO CAPS: 50.0000 mg | ORAL_CAPSULE | Freq: Three times a day (TID) | ORAL | Status: DC | PRN

## 2016-05-04 MED ORDER — INDOMETHACIN 50 MG RE SUPP: 50.0000 mg | Freq: Three times a day (TID) | RECTAL | Status: DC

## 2016-05-04 NOTE — Telephone Encounter (Signed)
Pt states he is having a gout flare up and Dr. Redmond School told him to call when he had flare up and he would call something into the pharmacy,  He takes Allopurinol daily and he thinks it's Indocin that Dr. Redmond School calls in for flare ups.  He hasn't had one in a year.  He states he didn't have the money for his co pay today

## 2016-05-04 NOTE — Telephone Encounter (Signed)
Indocin sent to pharmacy.

## 2016-05-04 NOTE — Telephone Encounter (Signed)
Called pharmacy & Indocin changed to capsule not suppository.  Pt informed

## 2016-05-08 DIAGNOSIS — E291 Testicular hypofunction: Secondary | ICD-10-CM | POA: Diagnosis not present

## 2016-05-23 ENCOUNTER — Ambulatory Visit (INDEPENDENT_AMBULATORY_CARE_PROVIDER_SITE_OTHER): Payer: Federal, State, Local not specified - PPO | Admitting: Family Medicine

## 2016-05-23 ENCOUNTER — Encounter: Payer: Self-pay | Admitting: Family Medicine

## 2016-05-23 VITALS — BP 126/88 | HR 76 | Wt 272.0 lb

## 2016-05-23 DIAGNOSIS — R319 Hematuria, unspecified: Secondary | ICD-10-CM

## 2016-05-23 LAB — POCT URINALYSIS DIPSTICK
BILIRUBIN UA: NEGATIVE
Glucose, UA: NEGATIVE
KETONES UA: NEGATIVE
LEUKOCYTES UA: NEGATIVE
Nitrite, UA: NEGATIVE
PH UA: 6
PROTEIN UA: NEGATIVE
RBC UA: NEGATIVE
Spec Grav, UA: >=1.03
Urobilinogen, UA: NEGATIVE

## 2016-05-23 NOTE — Progress Notes (Signed)
   Subjective:    Patient ID: Jay Delacruz, male    DOB: 12-21-1956, 60 y.o.   MRN: [id number]  HPI he is here for evaluation of seeing blood in his urine. He describes finishing urination and then seeing a few drops of blood on 2 separate occasions. No burning discharge lower abdominal pain back pain. No hesitancy, decreased stream or incomplete emptying. The first episode occurred 6 weeks ago and the most recent one was about 2 weeks ago.  Review of Systems     Objective:   Physical Exam  Penis appears normal. Right testicle is atrophied, left is normal. Urine dipstick was negative      Assessment & Plan:  Hematuria - Plan: POCT Urinalysis Dipstick  I explained that since he presently has no symptoms at the present time and his urine is negative, I recommend that he return the next time he sees blood in his urine so we can evaluate it more acutely.

## 2016-05-29 ENCOUNTER — Ambulatory Visit (INDEPENDENT_AMBULATORY_CARE_PROVIDER_SITE_OTHER): Payer: Federal, State, Local not specified - PPO | Admitting: Family Medicine

## 2016-05-29 ENCOUNTER — Encounter: Payer: Self-pay | Admitting: Family Medicine

## 2016-05-29 VITALS — BP 120/78 | HR 70 | Ht 71.0 in | Wt 266.2 lb

## 2016-05-29 DIAGNOSIS — K219 Gastro-esophageal reflux disease without esophagitis: Secondary | ICD-10-CM

## 2016-05-29 NOTE — Patient Instructions (Signed)
Take the 40 mg of Prilosec daily for the next week.

## 2016-05-29 NOTE — Progress Notes (Signed)
Subjective:     Patient ID: Jay Delacruz, male   DOB: January 14, 1956, 60 y.o.   MRN: [id number]  HPI Jay Delacruz presents with a 3-4 week history of burning chest pain, odynophagia with any food, and an episode of vomiting 4 days ago.  He denies fever, chills, wt loss, solid or liquid food dysphagia, cough, and hematemesis. He has a history of GERD with similar symptoms 10-12 years ago that was triggered by greasy foods. He does not report a food trigger with his current symptoms. His symptoms in the past resolved with ~8 weeks of once daily prilosec.  For his current problems, he has tried tums which initially relieved symptoms, but now no longer works and has been on Financial controller for 1 day.    He just got married on Thursday and is leaving for his honeymoon after this appointment.     Review of Systems     Objective:   Physical Exam Alert and in no distress.  Pharyngeal area is normal. Neck is supple without adenopathy or thyromegaly.  Cardiac exam shows a regular sinus rhythm without murmurs or gallops.  Lungs are clear to auscultation.     Assessment /Plan :     Gastroesophageal reflux disease, esophagitis presence not specified Given his upcoming trip and current symptoms, we increase his omeprazole dose for the next week to 40 mg before restarting his once daily 20 mg regimen until symptoms resolve.       History and physical exam conducted by Marylen Ponto (Medical Student) in conjunction with Dr. Redmond School.

## 2016-07-04 DIAGNOSIS — E291 Testicular hypofunction: Secondary | ICD-10-CM | POA: Diagnosis not present

## 2016-07-09 ENCOUNTER — Other Ambulatory Visit: Payer: Self-pay | Admitting: Family Medicine

## 2016-07-10 DIAGNOSIS — E291 Testicular hypofunction: Secondary | ICD-10-CM | POA: Diagnosis not present

## 2016-08-07 ENCOUNTER — Ambulatory Visit (INDEPENDENT_AMBULATORY_CARE_PROVIDER_SITE_OTHER): Payer: Federal, State, Local not specified - PPO | Admitting: Family Medicine

## 2016-08-07 ENCOUNTER — Encounter: Payer: Self-pay | Admitting: Family Medicine

## 2016-08-07 VITALS — BP 136/88 | HR 88 | Ht 71.0 in | Wt 275.6 lb

## 2016-08-07 DIAGNOSIS — I10 Essential (primary) hypertension: Secondary | ICD-10-CM | POA: Diagnosis not present

## 2016-08-07 DIAGNOSIS — Z1159 Encounter for screening for other viral diseases: Secondary | ICD-10-CM

## 2016-08-07 DIAGNOSIS — E785 Hyperlipidemia, unspecified: Secondary | ICD-10-CM | POA: Diagnosis not present

## 2016-08-07 DIAGNOSIS — Z0000 Encounter for general adult medical examination without abnormal findings: Secondary | ICD-10-CM

## 2016-08-07 DIAGNOSIS — Z87891 Personal history of nicotine dependence: Secondary | ICD-10-CM | POA: Insufficient documentation

## 2016-08-07 DIAGNOSIS — E669 Obesity, unspecified: Secondary | ICD-10-CM

## 2016-08-07 DIAGNOSIS — M1000 Idiopathic gout, unspecified site: Secondary | ICD-10-CM | POA: Diagnosis not present

## 2016-08-07 DIAGNOSIS — K219 Gastro-esophageal reflux disease without esophagitis: Secondary | ICD-10-CM | POA: Diagnosis not present

## 2016-08-07 DIAGNOSIS — J302 Other seasonal allergic rhinitis: Secondary | ICD-10-CM

## 2016-08-07 DIAGNOSIS — E291 Testicular hypofunction: Secondary | ICD-10-CM

## 2016-08-07 LAB — COMPREHENSIVE METABOLIC PANEL
ALK PHOS: 57 U/L (ref 40–115)
ALT: 21 U/L (ref 9–46)
AST: 23 U/L (ref 10–35)
Albumin: 3.7 g/dL (ref 3.6–5.1)
BUN: 10 mg/dL (ref 7–25)
CHLORIDE: 104 mmol/L (ref 98–110)
CO2: 24 mmol/L (ref 20–31)
Calcium: 9 mg/dL (ref 8.6–10.3)
Creat: 0.86 mg/dL (ref 0.70–1.33)
GLUCOSE: 83 mg/dL (ref 65–99)
POTASSIUM: 4.3 mmol/L (ref 3.5–5.3)
Sodium: 136 mmol/L (ref 135–146)
Total Bilirubin: 0.5 mg/dL (ref 0.2–1.2)
Total Protein: 6.3 g/dL (ref 6.1–8.1)

## 2016-08-07 LAB — LIPID PANEL
CHOL/HDL RATIO: 3.5 ratio (ref <=5.0)
Cholesterol: 176 mg/dL (ref 125–200)
HDL: 51 mg/dL (ref >=40)
LDL CALC: 66 mg/dL (ref <130)
TRIGLYCERIDES: 293 mg/dL — AB (ref <150)
VLDL: 59 mg/dL — AB (ref <30)

## 2016-08-07 LAB — POCT URINALYSIS DIPSTICK
Bilirubin, UA: NEGATIVE
Blood, UA: NEGATIVE
Glucose, UA: NEGATIVE
Ketones, UA: NEGATIVE
LEUKOCYTES UA: NEGATIVE
NITRITE UA: NEGATIVE
PROTEIN UA: NEGATIVE
UROBILINOGEN UA: NEGATIVE
pH, UA: 6

## 2016-08-07 LAB — CBC WITH DIFFERENTIAL/PLATELET
BASOS PCT: 0 %
Basophils Absolute: 0 cells/uL (ref 0–200)
Eosinophils Absolute: 62 cells/uL (ref 15–500)
Eosinophils Relative: 2 %
HEMATOCRIT: 38.3 % — AB (ref 38.5–50.0)
Hemoglobin: 12.7 g/dL — ABNORMAL LOW (ref 13.2–17.1)
LYMPHS ABS: 1333 {cells}/uL (ref 850–3900)
MCH: 27.7 pg (ref 27.0–33.0)
MCHC: 33.2 g/dL (ref 32.0–36.0)
MCV: 83.6 fL (ref 80.0–100.0)
MPV: 9.1 fL (ref 7.5–12.5)
Monocytes Absolute: 310 cells/uL (ref 200–950)
Monocytes Relative: 10 %
NEUTROS ABS: 1395 {cells}/uL — AB (ref 1500–7800)
NEUTROS PCT: 45 %
Platelets: 189 10*3/uL (ref 140–400)
RBC: 4.58 MIL/uL (ref 4.20–5.80)
RDW: 17.1 % — AB (ref 11.0–15.0)
WBC: 3.1 10*3/uL — AB (ref 4.0–10.5)

## 2016-08-07 LAB — URIC ACID: Uric Acid, Serum: 6.5 mg/dL (ref 4.0–8.0)

## 2016-08-07 MED ORDER — ATORVASTATIN CALCIUM 40 MG PO TABS: ORAL_TABLET | ORAL | 3 refills | Status: DC

## 2016-08-07 MED ORDER — OMEGA-3-ACID ETHYL ESTERS 1 G PO CAPS: 4.0000 | ORAL_CAPSULE | Freq: Every day | ORAL | 3 refills | Status: DC

## 2016-08-07 MED ORDER — FENOFIBRATE 160 MG PO TABS: 160.0000 mg | ORAL_TABLET | Freq: Every day | ORAL | 3 refills | Status: DC

## 2016-08-07 MED ORDER — AZELASTINE-FLUTICASONE 137-50 MCG/ACT NA SUSP: 1.0000 | Freq: Two times a day (BID) | NASAL | 11 refills | Status: DC

## 2016-08-07 MED ORDER — LOSARTAN POTASSIUM-HCTZ 100-12.5 MG PO TABS: 1.0000 | ORAL_TABLET | Freq: Every day | ORAL | 3 refills | Status: DC

## 2016-08-07 MED ORDER — ALLOPURINOL 300 MG PO TABS: ORAL_TABLET | ORAL | 3 refills | Status: DC

## 2016-08-07 NOTE — Progress Notes (Signed)
Subjective:ous history of smoking in his youth. His allergies are under good control with Dymista and Flonase.    Patient ID: Jay Delacruz, male    DOB: June 20, 1956, 60 y.o.   MRN: [id number]  HPI He is here for complete examination. He does have an underlying history of gout and is doing well on his present medication regimen. He did have a breakthrough several months ago but is now doing quite nicely. He does have underlying reflux but rarely uses his PPI. He is seen by urology for treatment of hypogonadism and the palate see me were working well. He no longer has difficulty with erectile dysfunction since being placed on the testosterone. He continues on Lovaza as well as Lipitor for his hyperlipidemia. He does have a previous history of smoking in his youth. His exercise pattern is minimal but he does keep busy with work. He has been married again for approximately one year. He quit smoking in July of this year. Family and social history as well as health maintenance and immunizations were reviewed.   Review of Systems  All other systems reviewed and are negative.      Objective:   Physical Exam BP 136/88 (BP Location: Right Arm, Patient Position: Sitting, Cuff Size: Large)   Pulse 88   Ht 5\' 11"  (1.803 m)   Wt 275 lb 9.6 oz (125 kg)   BMI 38.44 kg/m   General Appearance:    Alert, cooperative, no distress, appears stated age  Head:    Normocephalic, without obvious abnormality, atraumatic  Eyes:    PERRL, conjunctiva/corneas clear, EOM's intact, fundi    benign  Ears:    Normal TM's and external ear canals  Nose:   Nares normal, mucosa normal, no drainage or sinus   tenderness  Throat:   Lips, mucosa, and tongue normal; teeth and gums normal  Neck:   Supple, no lymphadenopathy;  thyroid:  no   enlargement/tenderness/nodules; no carotid   bruit or JVD  Back:    Spine nontender, no curvature, ROM normal, no CVA     tenderness  Lungs:     Clear to auscultation bilaterally without  wheezes, rales or     ronchi; respirations unlabored  Chest Wall:    No tenderness or deformity   Heart:    Regular rate and rhythm, S1 and S2 normal, no murmur, rub   or gallop  Breast Exam:    No chest wall tenderness, masses or gynecomastia  Abdomen:     Soft, non-tender, nondistended, normoactive bowel sounds,    no masses, no hepatosplenomegaly  Genitalia:    Normal male external genitalia without lesions.  Testicles without masses.  No inguinal hernias.     Extremities:   No clubbing, cyanosis or edema  Pulses:   2+ and symmetric all extremities  Skin:   Skin color, texture, turgor normal, no rashes or lesions  Lymph nodes:   Cervical, supraclavicular, and axillary nodes normal  Neurologic:   CNII-XII intact, normal strength, sensation and gait; reflexes 2+ and symmetric throughout          Psych:   Normal mood, affect, hygiene and grooming.          Assessment & Plan:  Routine general medical examination at a health care facility - Plan: POCT Urinalysis Dipstick, CBC with Differential/Platelet, Comprehensive metabolic panel, Lipid panel  Essential hypertension - Plan: CBC with Differential/Platelet, Comprehensive metabolic panel, losartan-hydrochlorothiazide (HYZAAR) 100-12.5 MG tablet  Hypogonadism male  Idiopathic gout, unspecified  chronicity, unspecified site - Plan: Uric Acid, allopurinol (ZYLOPRIM) 300 MG tablet  Obesity (BMI 30-39.9) - Plan: CBC with Differential/Platelet, Comprehensive metabolic panel, Lipid panel  Hyperlipidemia with target LDL less than 100 - Plan: Lipid panel, omega-3 acid ethyl esters (LOVAZA) 1 g capsule, fenofibrate 160 MG tablet, atorvastatin (LIPITOR) 40 MG tablet  Former smoker  Need for hepatitis C screening test - Plan: Hepatitis C antibody  Other seasonal allergic rhinitis - Plan: Azelastine-Fluticasone (DYMISTA) 137-50 MCG/ACT SUSP  Gastroesophageal reflux disease, esophagitis presence not specified  He will continue on his present  medication regimen. Again instructed him to increase his physical activities and cut back on carbohydrates.

## 2016-08-07 NOTE — Patient Instructions (Signed)
Cut back to having 1 or 2 beers per day

## 2016-08-08 LAB — HEPATITIS C ANTIBODY: HCV Ab: NEGATIVE

## 2016-08-10 DIAGNOSIS — E291 Testicular hypofunction: Secondary | ICD-10-CM | POA: Diagnosis not present

## 2016-08-21 ENCOUNTER — Other Ambulatory Visit (HOSPITAL_COMMUNITY): Payer: Self-pay | Admitting: Urology

## 2016-08-21 DIAGNOSIS — R861 Abnormal level of hormones in specimens from male genital organs: Secondary | ICD-10-CM

## 2016-08-21 DIAGNOSIS — E291 Testicular hypofunction: Secondary | ICD-10-CM | POA: Diagnosis not present

## 2016-08-30 ENCOUNTER — Ambulatory Visit (HOSPITAL_COMMUNITY)
Admission: RE | Admit: 2016-08-30 | Discharge: 2016-08-30 | Disposition: A | Discharge status: Home / Self Care | Payer: Federal, State, Local not specified - PPO | Source: Ambulatory Visit | Attending: Urology | Admitting: Urology

## 2016-08-30 DIAGNOSIS — E291 Testicular hypofunction: Secondary | ICD-10-CM | POA: Insufficient documentation

## 2016-08-30 DIAGNOSIS — R891 Abnormal level of hormones in specimens from other organs, systems and tissues: Secondary | ICD-10-CM | POA: Diagnosis not present

## 2016-08-30 DIAGNOSIS — R861 Abnormal level of hormones in specimens from male genital organs: Secondary | ICD-10-CM | POA: Diagnosis not present

## 2016-08-30 DIAGNOSIS — I679 Cerebrovascular disease, unspecified: Secondary | ICD-10-CM | POA: Insufficient documentation

## 2016-08-30 MED ORDER — GADOBENATE DIMEGLUMINE 529 MG/ML IV SOLN
10.0000 mL | Freq: Once | INTRAVENOUS | Status: AC | PRN
  Administered 2016-08-30: 7 mL via INTRAVENOUS

## 2016-10-16 ENCOUNTER — Telehealth: Payer: Self-pay | Admitting: Family Medicine

## 2016-10-16 ENCOUNTER — Ambulatory Visit
Admission: RE | Admit: 2016-10-16 | Discharge: 2016-10-16 | Disposition: A | Discharge status: Home / Self Care | Payer: Federal, State, Local not specified - PPO | Source: Ambulatory Visit | Attending: Family Medicine | Admitting: Family Medicine

## 2016-10-16 ENCOUNTER — Encounter: Payer: Self-pay | Admitting: Family Medicine

## 2016-10-16 ENCOUNTER — Ambulatory Visit (INDEPENDENT_AMBULATORY_CARE_PROVIDER_SITE_OTHER): Payer: Federal, State, Local not specified - PPO | Admitting: Family Medicine

## 2016-10-16 VITALS — BP 130/90 | HR 91 | Wt 264.0 lb

## 2016-10-16 DIAGNOSIS — M79672 Pain in left foot: Secondary | ICD-10-CM

## 2016-10-16 DIAGNOSIS — S92515A Nondisplaced fracture of proximal phalanx of left lesser toe(s), initial encounter for closed fracture: Secondary | ICD-10-CM | POA: Diagnosis not present

## 2016-10-16 MED ORDER — TRAMADOL HCL 50 MG PO TABS: 50.0000 mg | ORAL_TABLET | Freq: Three times a day (TID) | ORAL | 0 refills | Status: DC | PRN

## 2016-10-16 NOTE — Telephone Encounter (Signed)
Called Tramadol into [facility], pt informed

## 2016-10-16 NOTE — Progress Notes (Signed)
   Subjective:    Patient ID: Jay Delacruz, male    DOB: [birth year], 60 y.o.   MRN: [id number]  HPI Saturday he injured his left foot and a hyperextension injury to the metatarsals. He has had difficulty walking since then. This occurred at home. He has continued to work.   Review of Systems     Objective:   Physical Exam Left foot exam shows tenderness palpation over all of the metatarsal heads. No swelling or deformity noted.       Assessment & Plan:  Left foot pain - Plan: DG Foot Complete Left, traMADol (ULTRAM) 50 MG tablet He is also to use 2 Aleve twice per day and tramadol if needed.

## 2016-11-09 DIAGNOSIS — E291 Testicular hypofunction: Secondary | ICD-10-CM | POA: Diagnosis not present

## 2016-11-20 DIAGNOSIS — E291 Testicular hypofunction: Secondary | ICD-10-CM | POA: Diagnosis not present

## 2016-12-20 DIAGNOSIS — E291 Testicular hypofunction: Secondary | ICD-10-CM | POA: Diagnosis not present

## 2017-01-25 ENCOUNTER — Encounter: Payer: Self-pay | Admitting: Family Medicine

## 2017-01-25 ENCOUNTER — Ambulatory Visit (INDEPENDENT_AMBULATORY_CARE_PROVIDER_SITE_OTHER): Payer: Federal, State, Local not specified - PPO | Admitting: Family Medicine

## 2017-01-25 VITALS — BP 126/78 | HR 93 | Wt 267.0 lb

## 2017-01-25 DIAGNOSIS — L02214 Cutaneous abscess of groin: Secondary | ICD-10-CM | POA: Diagnosis not present

## 2017-01-25 DIAGNOSIS — E785 Hyperlipidemia, unspecified: Secondary | ICD-10-CM

## 2017-01-25 MED ORDER — DOXYCYCLINE HYCLATE 100 MG PO TABS: 100.0000 mg | ORAL_TABLET | Freq: Two times a day (BID) | ORAL | 0 refills | Status: DC

## 2017-01-25 NOTE — Progress Notes (Signed)
   Subjective:    Patient ID: Jay Delacruz, male    DOB: 1956/06/02, 61 y.o.   MRN: [id number]  HPI He noted swelling and tenderness in the right inguinal area several days ago and it has been slowly getting worse. He has had previous difficulty with this. He also states that he has had some muscle aches and pains and thinks it's from Lipitor. He did stop the medication and now is free of discomfort.   Review of Systems     Objective:   Physical Exam 2 x 3 cm warm tender lesion noted in the right inguinal area with surrounding erythema.       Assessment & Plan:  Inguinal abscess  Hyperlipidemia with target LDL less than 100 The area was injected with Xylocaine and epinephrine. A 3 cm incision was made. No purulent material was noted but he did have a central chamber that was easily probed. The lesion was packed with iodoform. I will place him on doxycycline. He is to return here Monday for recheck and I will partially removed the packing. Will also start back on the Lipitor and if his symptoms recur, I will switch him to a different statin drug.

## 2017-01-28 ENCOUNTER — Ambulatory Visit (INDEPENDENT_AMBULATORY_CARE_PROVIDER_SITE_OTHER): Payer: Federal, State, Local not specified - PPO | Admitting: Family Medicine

## 2017-01-28 DIAGNOSIS — L02214 Cutaneous abscess of groin: Secondary | ICD-10-CM | POA: Diagnosis not present

## 2017-01-28 MED ORDER — SULFAMETHOXAZOLE-TRIMETHOPRIM 800-160 MG PO TABS: 1.0000 | ORAL_TABLET | Freq: Two times a day (BID) | ORAL | 0 refills | Status: DC

## 2017-01-28 NOTE — Progress Notes (Signed)
   Subjective:    Patient ID: Jay Delacruz, male    DOB: 1956/03/16, 61 y.o.   MRN: [id number]  HPI He is here for a recheck. He states that he is now noted some drainage and swelling in the area has gotten slightly worse.   Review of Systems     Objective:   Physical Exam Exam of the right inguinal area does show drainage just superior to the previous I+D. The wound was cultured. Packing is in place from the previous I + D       Assessment & Plan:  Inguinal abscess - Plan: WOUND CULTURE, sulfamethoxazole-trimethoprim (BACTRIM DS,SEPTRA DS) 800-160 MG tablet The area of induration is approximately 3 x 4". The wound superior to the previous incision was injected with Xylocaine. A 2 cm incision was made. Purulent drainage was expressed. The wound was cleaned and packed with iodoform. Gadolinium was again packed. I switched antibiotics on him to Septra. Will await culture results. He will return here in 2 days for recheck.

## 2017-01-30 ENCOUNTER — Ambulatory Visit (INDEPENDENT_AMBULATORY_CARE_PROVIDER_SITE_OTHER): Payer: Federal, State, Local not specified - PPO | Admitting: Family Medicine

## 2017-01-30 DIAGNOSIS — L02214 Cutaneous abscess of groin: Secondary | ICD-10-CM

## 2017-01-30 NOTE — Progress Notes (Signed)
   Subjective:    Patient ID: Jay Delacruz, male    DOB: December 30, 1955, 61 y.o.   MRN: [id number]  HPI He is here for a recheck. Review of the lab data shows his culture is not back yet. He does state that he is having less discomfort.   Review of Systems     Objective:   Physical Exam Right inguinal exam does show less erythema. The induration is still pretty much the same size.       Assessment & Plan:  Inguinal abscess A proximally 6 inches of iodoform gauze was removed from both wounds. He will be rechecked again on Friday.

## 2017-01-31 ENCOUNTER — Other Ambulatory Visit: Payer: Self-pay

## 2017-01-31 MED ORDER — AMOXICILLIN-POT CLAVULANATE 875-125 MG PO TABS: 1.0000 | ORAL_TABLET | Freq: Two times a day (BID) | ORAL | 0 refills | Status: DC

## 2017-02-01 ENCOUNTER — Encounter: Payer: Self-pay | Admitting: Medical

## 2017-02-01 ENCOUNTER — Ambulatory Visit (INDEPENDENT_AMBULATORY_CARE_PROVIDER_SITE_OTHER): Payer: Federal, State, Local not specified - PPO | Admitting: Medical

## 2017-02-01 VITALS — BP 114/80 | HR 79 | Temp 98.7°F | Wt 263.4 lb

## 2017-02-01 DIAGNOSIS — Z09 Encounter for follow-up examination after completed treatment for conditions other than malignant neoplasm: Secondary | ICD-10-CM

## 2017-02-01 LAB — WOUND CULTURE: GRAM STAIN: NONE SEEN

## 2017-02-01 NOTE — Progress Notes (Signed)
Subjective: Chief Complaint  Patient presents with  . follow up    follow up  from cyst removed , no issues.   Here for 2 day f/u.  Saw Dr. Redmond School few days ago for I&D recheck.   Had packing placed that day.  He notes less pain, less induration.  No fever, no nausea.   Is taking the antibiotics given by Dr. Redmond School.   Area is right groin.  No other aggravating or relieving factors. No other complaint.  Objective: BP 114/80   Pulse 79   Temp 98.7 F (37.1 C)   Wt 263 lb 6.4 oz (119.5 kg)   SpO2 98%   BMI 36.74 kg/m   Gen: wd, wn, nad, Right groin intertriginous area with 2 incision marks with packing placed, induration approx 4cm diameter  Assessment: Encounter Diagnosis  Name Primary?  . Encounter for recheck of abscess following incision and drainage Yes     Plan: Discussed findings.   He agrees to wound check, repacking.  Cleaned the right intertrigonous areas betadine.   Removed packing, irrigated both incision sites with saline, repacked with sterile 1/4" iodoform packing. Covered with sterile bandage.  Patient tolerated procedure well  C/t warm compresses, antibiotic, and f/u Monday for packing removal and wound check

## 2017-02-04 ENCOUNTER — Ambulatory Visit (INDEPENDENT_AMBULATORY_CARE_PROVIDER_SITE_OTHER): Payer: Federal, State, Local not specified - PPO | Admitting: Family Medicine

## 2017-02-04 DIAGNOSIS — Z09 Encounter for follow-up examination after completed treatment for conditions other than malignant neoplasm: Secondary | ICD-10-CM

## 2017-02-04 NOTE — Progress Notes (Signed)
   Subjective:    Patient ID: Jay Delacruz, male    DOB: [birth year], 61 y.o.   MRN: [id number]  HPI He is here for a recheck. He has been switched to a different antibiotic to cover the offending organism. He is having much less difficulty and no drainage. He continues to irrigate the area.   Review of Systems     Objective:   Physical Exam Right inguinal exam shows the induration to be about half the previous size with no pain on palpation.       Assessment & Plan:  Encounter for recheck of abscess following incision and drainage The rest of the packing was removed. He will continue to irrigate the area 3 times per day and use stressing on the outside until the wound heals from inside out.

## 2017-03-12 ENCOUNTER — Ambulatory Visit (INDEPENDENT_AMBULATORY_CARE_PROVIDER_SITE_OTHER): Payer: Federal, State, Local not specified - PPO | Admitting: Family Medicine

## 2017-03-12 VITALS — BP 130/90 | HR 82 | Wt 270.0 lb

## 2017-03-12 DIAGNOSIS — M461 Sacroiliitis, not elsewhere classified: Secondary | ICD-10-CM

## 2017-03-12 NOTE — Patient Instructions (Signed)
Heat for 20 minutes 3 times per day. Stretching afterwards. 2 Aleve twice per day. Do this for the next 10 days to 2 weeks

## 2017-03-12 NOTE — Progress Notes (Signed)
   Subjective:    Patient ID: Jay Delacruz, male    DOB: [birth year], 61 y.o.   MRN: [id number]  HPI Before day history of right-sided low back pain. No history of injury to the back. No numbness, tingling or weakness. No other joints are involved. He does note increased pain when he lies on that side.   Review of Systems     Objective:   Physical Exam Alert and in no distress. Normal lumbar curve. Tender to palpation over the right SI joint. Stork test [employer] testing was positive. Normal hip motion. Negative straight leg raising and normal DTRs.       Assessment & Plan:  Sacroiliitis ([place])  Guss diagnosis of sacroiliitis. Recommend heat for 20 minutes 3 times per day, stretching and 2 Aleve twice per day. Discussed follow-up if this does not improve with physical therapy and/or chiropractic manipulation.

## 2017-03-13 ENCOUNTER — Other Ambulatory Visit: Payer: Self-pay | Admitting: Family Medicine

## 2017-03-19 DIAGNOSIS — E291 Testicular hypofunction: Secondary | ICD-10-CM | POA: Diagnosis not present

## 2017-03-26 DIAGNOSIS — E291 Testicular hypofunction: Secondary | ICD-10-CM | POA: Diagnosis not present

## 2017-03-26 DIAGNOSIS — T888 Other specified complications of surgical and medical care, not elsewhere classified: Secondary | ICD-10-CM | POA: Diagnosis not present

## 2017-08-14 ENCOUNTER — Other Ambulatory Visit: Payer: Self-pay | Admitting: Family Medicine

## 2017-08-14 ENCOUNTER — Other Ambulatory Visit: Payer: Self-pay

## 2017-08-14 DIAGNOSIS — E785 Hyperlipidemia, unspecified: Secondary | ICD-10-CM

## 2017-08-14 DIAGNOSIS — M1000 Idiopathic gout, unspecified site: Secondary | ICD-10-CM

## 2017-08-14 DIAGNOSIS — I10 Essential (primary) hypertension: Secondary | ICD-10-CM

## 2017-08-14 NOTE — Telephone Encounter (Signed)
Called and l/m for pt call us back to make an appt for refill on meds.

## 2017-10-24 ENCOUNTER — Encounter: Payer: Self-pay | Admitting: Family Medicine

## 2017-10-24 ENCOUNTER — Ambulatory Visit (INDEPENDENT_AMBULATORY_CARE_PROVIDER_SITE_OTHER): Payer: Federal, State, Local not specified - PPO | Admitting: Family Medicine

## 2017-10-24 VITALS — BP 140/90 | HR 81 | Wt 275.6 lb

## 2017-10-24 DIAGNOSIS — I10 Essential (primary) hypertension: Secondary | ICD-10-CM

## 2017-10-24 DIAGNOSIS — K219 Gastro-esophageal reflux disease without esophagitis: Secondary | ICD-10-CM | POA: Diagnosis not present

## 2017-10-24 DIAGNOSIS — R7309 Other abnormal glucose: Secondary | ICD-10-CM | POA: Diagnosis not present

## 2017-10-24 DIAGNOSIS — E785 Hyperlipidemia, unspecified: Secondary | ICD-10-CM

## 2017-10-24 DIAGNOSIS — M1000 Idiopathic gout, unspecified site: Secondary | ICD-10-CM

## 2017-10-24 DIAGNOSIS — E291 Testicular hypofunction: Secondary | ICD-10-CM | POA: Diagnosis not present

## 2017-10-24 DIAGNOSIS — L309 Dermatitis, unspecified: Secondary | ICD-10-CM | POA: Diagnosis not present

## 2017-10-24 DIAGNOSIS — E669 Obesity, unspecified: Secondary | ICD-10-CM

## 2017-10-24 DIAGNOSIS — Z23 Encounter for immunization: Secondary | ICD-10-CM

## 2017-10-24 MED ORDER — TESTOSTERONE CYPIONATE 200 MG/ML IM SOLN: 200.0000 mg | INTRAMUSCULAR | 3 refills | Status: DC

## 2017-10-24 MED ORDER — LOSARTAN POTASSIUM-HCTZ 100-12.5 MG PO TABS: 1.0000 | ORAL_TABLET | Freq: Every day | ORAL | 3 refills | Status: DC

## 2017-10-24 MED ORDER — ATORVASTATIN CALCIUM 40 MG PO TABS: ORAL_TABLET | ORAL | 3 refills | Status: DC

## 2017-10-24 MED ORDER — ALLOPURINOL 300 MG PO TABS: ORAL_TABLET | ORAL | 3 refills | Status: DC

## 2017-10-24 NOTE — Progress Notes (Signed)
   Subjective:    Patient ID: Jay Delacruz, male    DOB: 27-Dec-1955, 61 y.o.   MRN: [id number]  HPI He is here for an interval evaluation. He does have difficulty with eczema and has been using Lidex on his lesions present on the legs but still having difficulty with them. He also has reflux and uses Prilosec on an as-needed basis. He does have hypogonadism and in the passive been using pellets however his insurance will no longer pay for this. He did not do well on the topicals causing a dermatitis. He is looking for other options. His weight is essentially unchanged. He continues on his blood pressure medications as well as allopurinol. In the past he has had some difficulty with erectile dysfunction but it is mainly of testosterone related issue.   Review of Systems     Objective:   Physical Exam Alert and in no distress. Tympanic membranes and canals are normal. Pharyngeal area is normal. Neck is supple without adenopathy or thyromegaly. Cardiac exam shows a regular sinus rhythm without murmurs or gallops. Lungs are clear to auscultation.        Assessment & Plan:  Hypogonadism male - Plan: testosterone cypionate (DEPOTESTOSTERONE CYPIONATE) 200 MG/ML injection, CBC with Differential/Platelet, Comprehensive metabolic panel, Lipid panel, Testosterone, PSA  Needs flu shot - Plan: Flu Vaccine QUAD 36+ mos IM  Obesity (BMI 30-39.9) - Plan: CBC with Differential/Platelet, Comprehensive metabolic panel, Lipid panel  Essential hypertension - Plan: losartan-hydrochlorothiazide (HYZAAR) 100-12.5 MG tablet  Hyperlipidemia with target LDL less than 100 - Plan: Lipid panel, atorvastatin (LIPITOR) 40 MG tablet  Idiopathic gout, unspecified chronicity, unspecified site - Plan: Uric Acid, allopurinol (ZYLOPRIM) 300 MG tablet  Gastroesophageal reflux disease, esophagitis presence not specified  Eczema, unspecified type - Plan: Ambulatory referral to Dermatology I will have him bring his  testosterone and we will teach him how to give shots. He is to come back one week into taking the shot to have a testosterone level drawn.  I encouraged him to continue to work on his weight. He will continue on his blood pressure medication as well as statin and allopurinol. Discussed his reflux and he is handling this well. Since he has not responded to Lidex, I will refer him to dermatology.

## 2017-10-25 ENCOUNTER — Other Ambulatory Visit: Payer: Self-pay | Admitting: Family Medicine

## 2017-10-25 DIAGNOSIS — E785 Hyperlipidemia, unspecified: Secondary | ICD-10-CM

## 2017-10-25 NOTE — Telephone Encounter (Signed)
Is this okay to refill since he had lipids checked the other day

## 2017-10-27 ENCOUNTER — Telehealth: Payer: Self-pay | Admitting: Medical

## 2017-10-27 NOTE — Telephone Encounter (Signed)
P.A. TESTOSTERONE CYPIONATE  

## 2017-10-29 ENCOUNTER — Other Ambulatory Visit: Payer: Federal, State, Local not specified - PPO

## 2017-10-29 LAB — COMPREHENSIVE METABOLIC PANEL
AG RATIO: 1.5 (calc) (ref 1.0–2.5)
ALBUMIN MSPROF: 4.1 g/dL (ref 3.6–5.1)
ALT: 23 U/L (ref 9–46)
AST: 28 U/L (ref 10–35)
Alkaline phosphatase (APISO): 71 U/L (ref 40–115)
BUN: 12 mg/dL (ref 7–25)
CHLORIDE: 103 mmol/L (ref 98–110)
CO2: 24 mmol/L (ref 20–32)
CREATININE: 0.87 mg/dL (ref 0.70–1.33)
Calcium: 10 mg/dL (ref 8.6–10.3)
GLOBULIN: 2.8 g/dL (ref 1.9–3.7)
GLUCOSE: 112 mg/dL — AB (ref 65–99)
POTASSIUM: 3.9 mmol/L (ref 3.5–5.3)
SODIUM: 137 mmol/L (ref 135–146)
Total Bilirubin: 0.6 mg/dL (ref 0.2–1.2)
Total Protein: 6.9 g/dL (ref 6.1–8.1)

## 2017-10-29 LAB — TEST AUTHORIZATION

## 2017-10-29 LAB — CBC WITH DIFFERENTIAL/PLATELET
BASOS PCT: 0.7 %
Basophils Absolute: 32 cells/uL (ref 0–200)
EOS ABS: 122 {cells}/uL (ref 15–500)
Eosinophils Relative: 2.7 %
HCT: 40.3 % (ref 38.5–50.0)
HEMOGLOBIN: 13.4 g/dL (ref 13.2–17.1)
Lymphs Abs: 1638 cells/uL (ref 850–3900)
MCH: 26.4 pg — AB (ref 27.0–33.0)
MCHC: 33.3 g/dL (ref 32.0–36.0)
MCV: 79.3 fL — ABNORMAL LOW (ref 80.0–100.0)
MONOS PCT: 8.5 %
MPV: 10.9 fL (ref 7.5–12.5)
NEUTROS ABS: 2327 {cells}/uL (ref 1500–7800)
Neutrophils Relative %: 51.7 %
PLATELETS: 197 10*3/uL (ref 140–400)
RBC: 5.08 10*6/uL (ref 4.20–5.80)
RDW: 15.1 % — ABNORMAL HIGH (ref 11.0–15.0)
Total Lymphocyte: 36.4 %
WBC: 4.5 10*3/uL (ref 3.8–10.8)
WBCMIX: 383 {cells}/uL (ref 200–950)

## 2017-10-29 LAB — HEMOGLOBIN A1C W/OUT EAG: Hgb A1c MFr Bld: 5.7 % of total Hgb — ABNORMAL HIGH (ref <5.7)

## 2017-10-29 LAB — LIPID PANEL
CHOL/HDL RATIO: 4.7 (calc) (ref <5.0)
Cholesterol: 207 mg/dL — ABNORMAL HIGH (ref <200)
HDL: 44 mg/dL (ref >40)
NON-HDL CHOLESTEROL (CALC): 163 mg/dL — AB (ref <130)
Triglycerides: 561 mg/dL — ABNORMAL HIGH (ref <150)

## 2017-10-29 LAB — TESTOSTERONE: Testosterone: 296 ng/dL (ref 250–827)

## 2017-10-29 LAB — URIC ACID: URIC ACID, SERUM: 6.9 mg/dL (ref 4.0–8.0)

## 2017-10-29 LAB — PSA: PSA: 0.3 ng/mL (ref <=4.0)

## 2017-10-29 MED ORDER — SYRINGE/NEEDLE (DISP) 18G X 1" 3 ML MISC
1 refills | Status: DC
Start: 2017-10-29 — End: 2020-05-11

## 2017-10-29 NOTE — Progress Notes (Unsigned)
Pt came in today to learn how to give testosterone shots. I explained to pt's wife how to know which area to give the shot at, and how to draw up the medication and inject. I will send in syringes for the testosterone and draw up and inject with 18g.

## 2017-10-31 ENCOUNTER — Other Ambulatory Visit: Payer: Self-pay | Admitting: Family Medicine

## 2017-11-02 NOTE — Telephone Encounter (Signed)
P.A. Approved til 10/27/18

## 2017-11-04 NOTE — Telephone Encounter (Signed)
Pt informed and he has picked up medication

## 2017-11-08 ENCOUNTER — Other Ambulatory Visit: Payer: Self-pay | Admitting: Family Medicine

## 2017-11-08 NOTE — Telephone Encounter (Signed)
Is this okay to refill? 

## 2017-11-11 ENCOUNTER — Telehealth: Payer: Self-pay

## 2017-11-11 NOTE — Telephone Encounter (Signed)
Pt called asking why rx for lidex was refused yesterday on 11/06/17. Jay Delacruz

## 2017-11-12 MED ORDER — FLUOCINONIDE 0.05 % EX CREA: TOPICAL_CREAM | CUTANEOUS | 2 refills | Status: DC

## 2017-11-12 NOTE — Telephone Encounter (Signed)
Called in lidex and LM on pt VCM that this has been called in. RLB

## 2017-11-12 NOTE — Telephone Encounter (Signed)
Apparently according to him, the medicine was not working and he was supposed to see his dermatologist.  Work with him to make sure he gets in

## 2018-02-07 ENCOUNTER — Ambulatory Visit: Payer: Federal, State, Local not specified - PPO | Admitting: Family Medicine

## 2018-02-07 VITALS — BP 140/88 | HR 69 | Temp 98.0°F | Wt 288.0 lb

## 2018-02-07 DIAGNOSIS — L723 Sebaceous cyst: Secondary | ICD-10-CM | POA: Diagnosis not present

## 2018-02-07 DIAGNOSIS — L089 Local infection of the skin and subcutaneous tissue, unspecified: Secondary | ICD-10-CM | POA: Diagnosis not present

## 2018-02-07 NOTE — Patient Instructions (Signed)
Remove the packing Sunday and irrigate that area several times a day.  Put an antibiotic ointment on it to keep it open

## 2018-02-07 NOTE — Progress Notes (Signed)
   Subjective:    Patient ID: Jay Delacruz, male    DOB: 10-11-1956, 62 y.o.   MRN: [id number]  HPI He has noted difficulty with swelling in the mid back area for the last week that has gotten worse and become slightly uncomfortable in the last several days.   Review of Systems     Objective:   Physical Exam 4 cm red tender lesion is noted on the mid upper back area.       Assessment & Plan:  Infected sebaceous cyst The lesion was injected with Xylocaine and epinephrine.  A 3 incision was made.  A small amount of purulent material was expressed and the wound was explored and cleaned with gauze.  It was packed with iodoform.  He is to leave the packing in place for 2 days and then remove it.  He is to then irrigate the area several times per day with water.  Return here if further trouble.

## 2018-02-26 ENCOUNTER — Telehealth: Payer: Self-pay

## 2018-02-26 ENCOUNTER — Other Ambulatory Visit: Payer: Self-pay | Admitting: Family Medicine

## 2018-02-26 DIAGNOSIS — L309 Dermatitis, unspecified: Secondary | ICD-10-CM | POA: Diagnosis not present

## 2018-02-26 DIAGNOSIS — E785 Hyperlipidemia, unspecified: Secondary | ICD-10-CM

## 2018-02-26 NOTE — Telephone Encounter (Signed)
Pt says he is still taking the omega 3 and still has enough to take . Thanks [employer]

## 2018-03-20 ENCOUNTER — Telehealth: Payer: Self-pay

## 2018-03-20 NOTE — Telephone Encounter (Signed)
Called pt to see if he still was taking omega 3 acid. No answer lvm. [place]

## 2018-04-09 ENCOUNTER — Other Ambulatory Visit: Payer: Self-pay | Admitting: Family Medicine

## 2018-04-09 DIAGNOSIS — E785 Hyperlipidemia, unspecified: Secondary | ICD-10-CM

## 2018-04-30 ENCOUNTER — Telehealth: Payer: Self-pay

## 2018-04-30 NOTE — Telephone Encounter (Signed)
Pt says he is taking his omega 3. [place]

## 2018-05-26 ENCOUNTER — Other Ambulatory Visit: Payer: Self-pay | Admitting: Family Medicine

## 2018-05-26 DIAGNOSIS — E291 Testicular hypofunction: Secondary | ICD-10-CM

## 2018-05-27 NOTE — Telephone Encounter (Signed)
Pt has some testosterone to last until Friday appt with you please advise if it should be fill at [facility] on [address]. [place]

## 2018-05-27 NOTE — Telephone Encounter (Signed)
Called pt and let him know that we got a refill request for testosterone and he is due for a recheck. Pt is coming in Friday and has enough to last until appt. [place]

## 2018-05-30 ENCOUNTER — Ambulatory Visit: Payer: Federal, State, Local not specified - PPO | Admitting: Family Medicine

## 2018-05-30 ENCOUNTER — Encounter: Payer: Self-pay | Admitting: Family Medicine

## 2018-05-30 VITALS — BP 154/98 | HR 62 | Temp 97.8°F | Ht 72.0 in | Wt 283.6 lb

## 2018-05-30 DIAGNOSIS — Z79899 Other long term (current) drug therapy: Secondary | ICD-10-CM | POA: Diagnosis not present

## 2018-05-30 DIAGNOSIS — E291 Testicular hypofunction: Secondary | ICD-10-CM

## 2018-05-30 NOTE — Progress Notes (Signed)
   Subjective:    Patient ID: Jay Delacruz, male    DOB: [birth year], 62 y.o.   MRN: [id number]  HPI He is here for recheck on testosterone.  He is not getting shots every 2 weeks using 1 cc of testosterone.  He is due for his next shot tomorrow so this will be a trough level.  He states that he has good energy and stamina and has no difficulty with getting shots.  He cannot necessarily tell when it wears off.   Review of Systems     Objective:   Physical Exam Alert and in no distress otherwise not examined       Assessment & Plan:  Hypogonadism male - Plan: Testosterone  Encounter for long-term (current) use of medications - Plan: Testosterone

## 2018-05-31 LAB — TESTOSTERONE: TESTOSTERONE: 101 ng/dL — AB (ref 264–916)

## 2018-06-02 DIAGNOSIS — L309 Dermatitis, unspecified: Secondary | ICD-10-CM | POA: Diagnosis not present

## 2018-06-02 NOTE — Addendum Note (Signed)
Addended by: Denita Lung on: 06/02/2018 01:20 PM   Modules accepted: Orders

## 2018-06-27 IMAGING — CR DG FOOT COMPLETE 3+V*L*
3 series · 3 of 3 positions shown · non-contrast
Comparison: 08/23/2014

CLINICAL DATA: Recent fall, pain in the left foot across the
metatarsals

EXAM:
LEFT FOOT - COMPLETE 3+ VIEW

[t foot ap left]
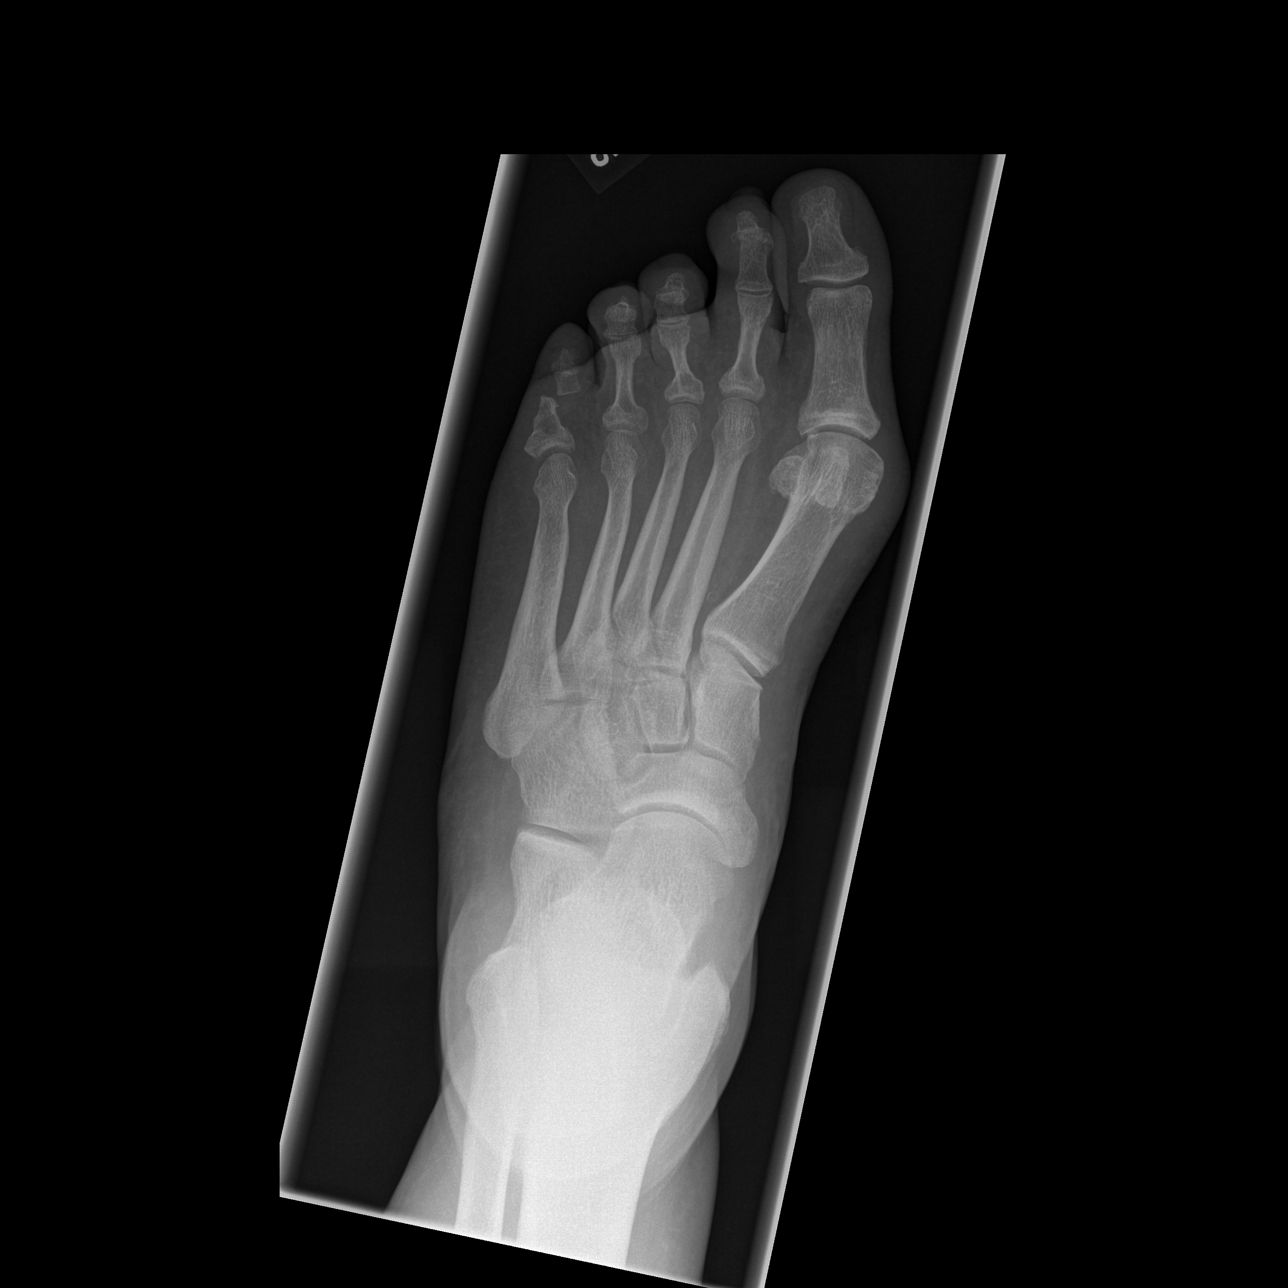

[t foot oblique left]
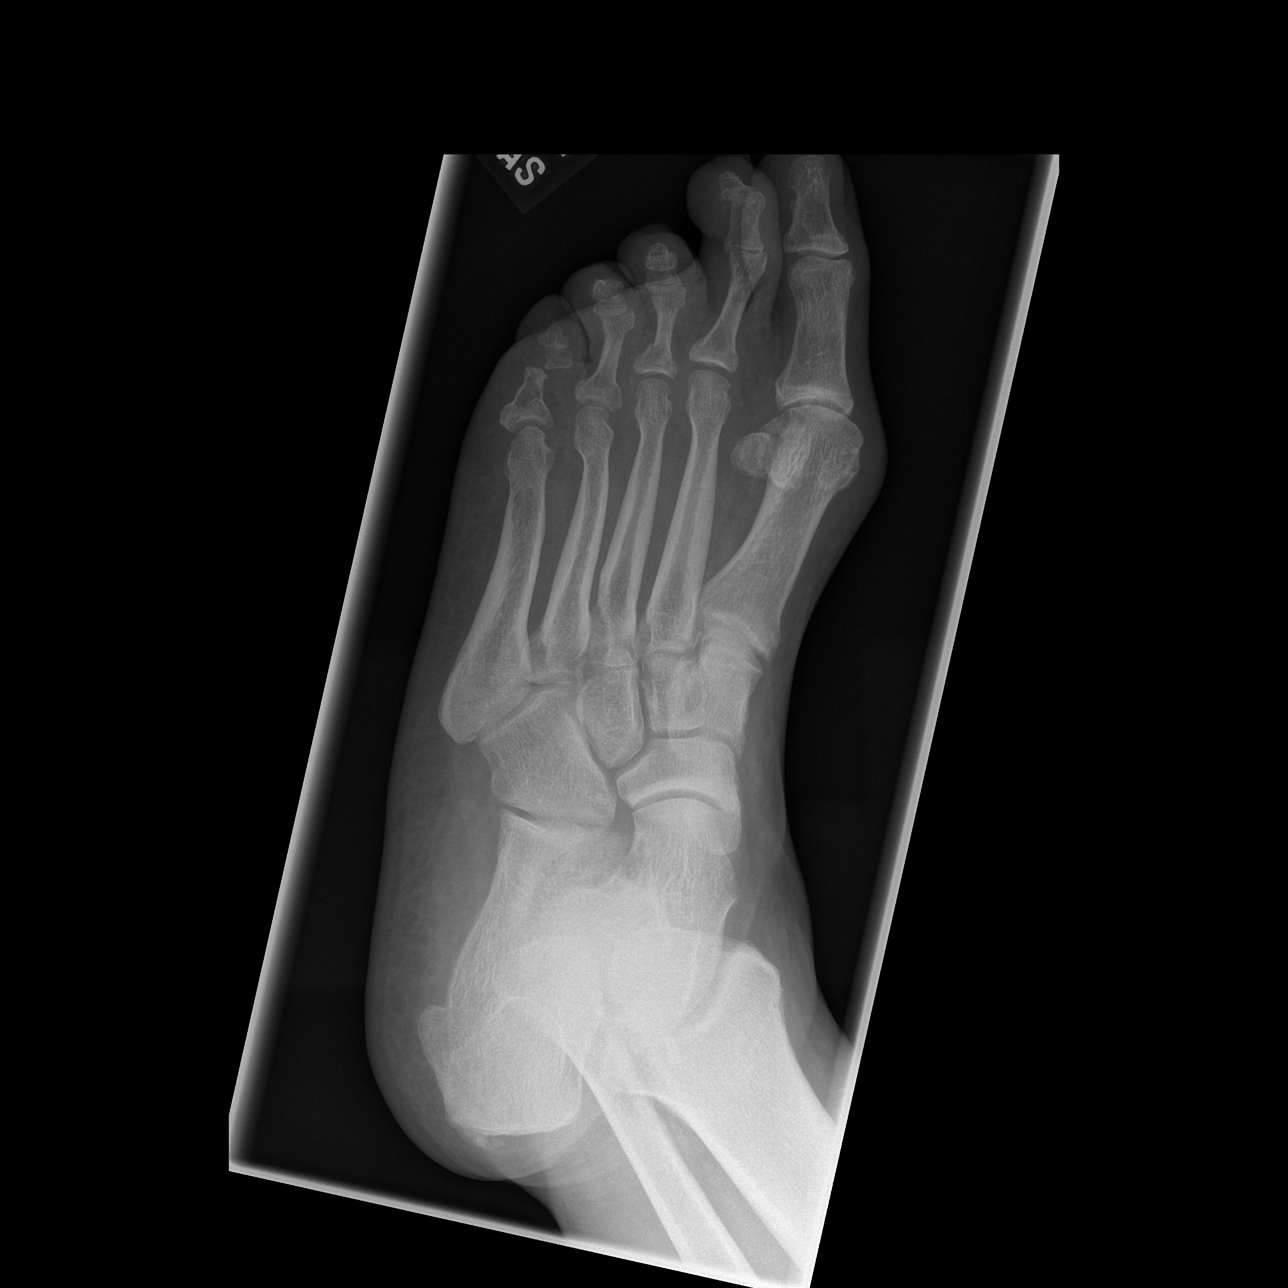

[t foot lat left]
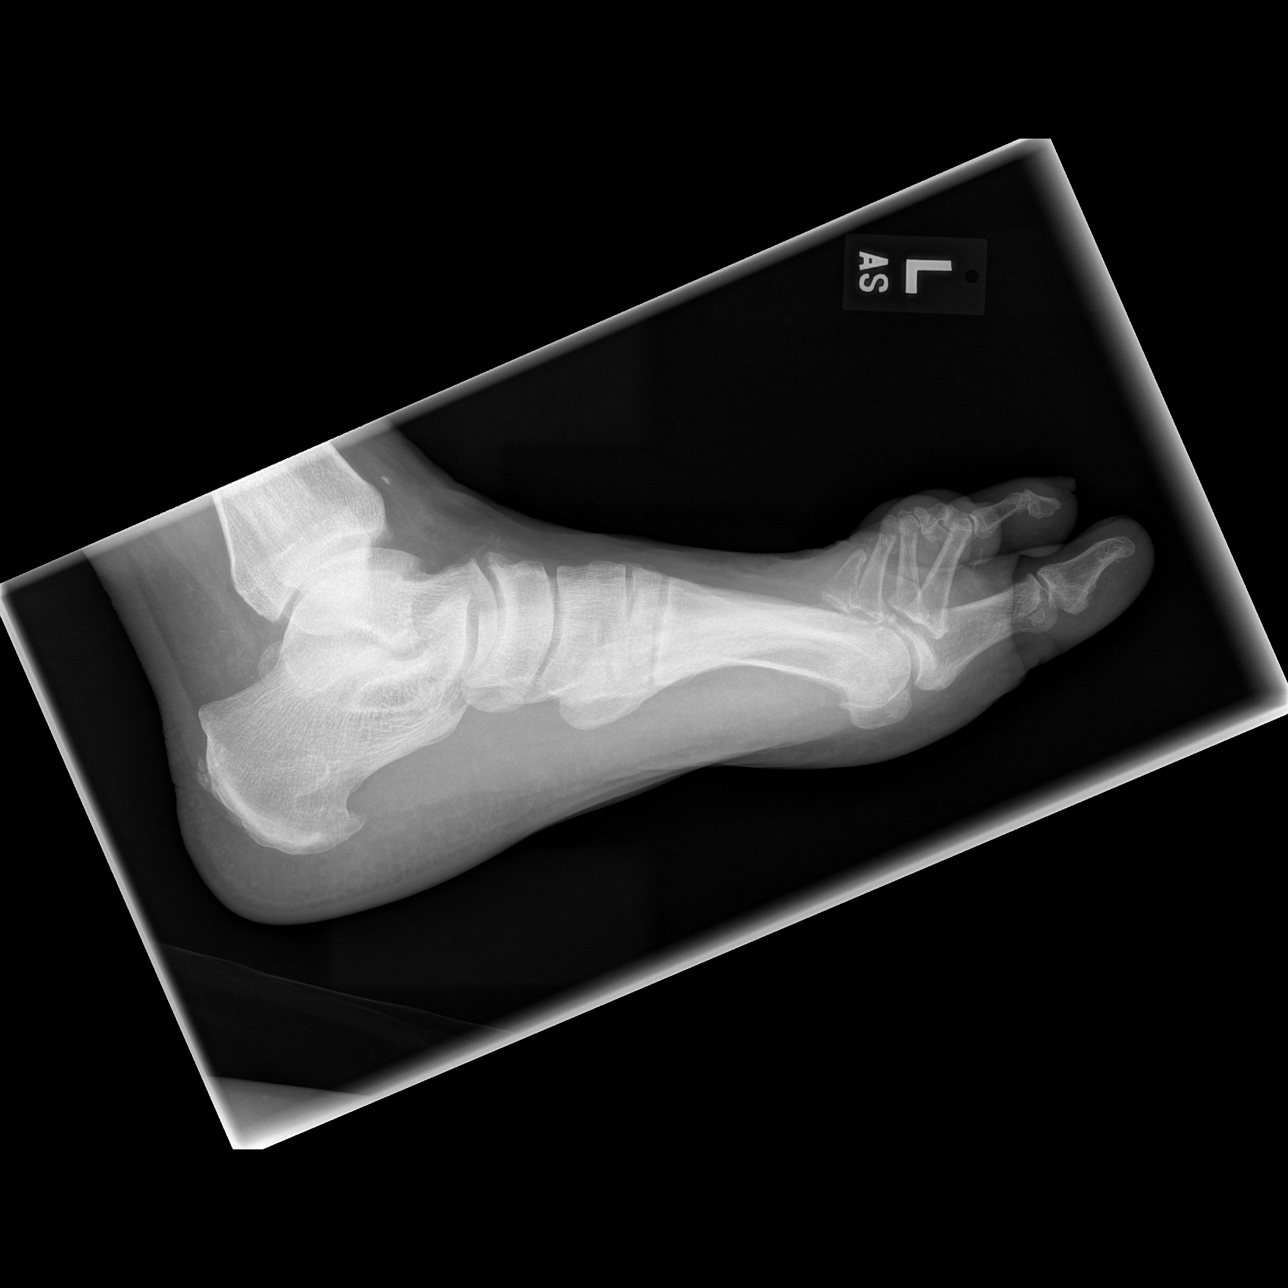

[3 of 3 positions shown; findings below may reference images not displayed]

FINDINGS: Bones appear osteopenic. Soft tissue vascular calcifications are
present. Acute, nondisplaced fracture involving the proximal aspect
of the third proximal phalanx with possible articular extension to
the MTP joint. Possible additional nondisplaced fracture involving
the base of the fourth proximal phalanx.

Abnormal morphology above the left distal digit. The proximal
phalanx is malformed with tapering distally. The middle and distal
phalanges are subluxed in a medial direction. This is of unknown
chronicity. Moderate plantar and posterior calcaneal spurs.
IMPRESSION: 1. Osteopenia limits exam.
2. Nondisplaced possible intra-articular fracture involving the base
of the third proximal phalanx. Suspect additional nondisplaced
fracture involving the base of the fourth proximal phalanx.
3. Abnormal morphology of the fifth proximal phalanx, possibly
related to prior trauma, infection, or developmental abnormality.
Fifth middle and distal phalanx are subluxed medially with respect
to the malformed proximal phalanx, this is of uncertain chronicity.

## 2018-06-30 ENCOUNTER — Other Ambulatory Visit: Payer: Federal, State, Local not specified - PPO

## 2018-06-30 DIAGNOSIS — E291 Testicular hypofunction: Secondary | ICD-10-CM | POA: Diagnosis not present

## 2018-07-01 LAB — TESTOSTERONE: TESTOSTERONE: 75 ng/dL — AB (ref 264–916)

## 2018-07-07 ENCOUNTER — Encounter: Payer: Self-pay | Admitting: Family Medicine

## 2018-07-07 ENCOUNTER — Ambulatory Visit: Payer: Federal, State, Local not specified - PPO | Admitting: Family Medicine

## 2018-07-07 ENCOUNTER — Other Ambulatory Visit: Payer: Self-pay

## 2018-07-07 ENCOUNTER — Telehealth: Payer: Self-pay

## 2018-07-07 VITALS — BP 146/88 | HR 78 | Temp 97.6°F | Wt 280.6 lb

## 2018-07-07 DIAGNOSIS — J0100 Acute maxillary sinusitis, unspecified: Secondary | ICD-10-CM

## 2018-07-07 DIAGNOSIS — E291 Testicular hypofunction: Secondary | ICD-10-CM

## 2018-07-07 DIAGNOSIS — J3489 Other specified disorders of nose and nasal sinuses: Secondary | ICD-10-CM | POA: Diagnosis not present

## 2018-07-07 DIAGNOSIS — H1033 Unspecified acute conjunctivitis, bilateral: Secondary | ICD-10-CM | POA: Diagnosis not present

## 2018-07-07 MED ORDER — POLYMYXIN B-TRIMETHOPRIM 10000-0.1 UNIT/ML-% OP SOLN: 1.0000 [drp] | Freq: Four times a day (QID) | OPHTHALMIC | 0 refills | Status: DC

## 2018-07-07 MED ORDER — MUPIROCIN 2 % EX OINT: 1.0000 "application " | TOPICAL_OINTMENT | Freq: Two times a day (BID) | CUTANEOUS | 0 refills | Status: DC

## 2018-07-07 MED ORDER — AMOXICILLIN-POT CLAVULANATE 875-125 MG PO TABS: 1.0000 | ORAL_TABLET | Freq: Two times a day (BID) | ORAL | 0 refills | Status: DC

## 2018-07-07 MED ORDER — TESTOSTERONE CYPIONATE 200 MG/ML IM SOLN: INTRAMUSCULAR | 0 refills | Status: DC

## 2018-07-07 NOTE — Patient Instructions (Signed)
Take the antibiotic as prescribed. Use the eye drops and topical antibiotic in your nares as prescribed.   If you are not improving or get worse then let us know.

## 2018-07-07 NOTE — Telephone Encounter (Signed)
Pt called stating his testosterone was changed from every 14 days to every 7 days and he needs a new prescription. Please advise patient

## 2018-07-07 NOTE — Telephone Encounter (Signed)
Called pt to advise if he wanted to pick up testosterone early it would cost him 55.00 for two vials. Pt says he was  Told to take med every 7 days and I dont see it in the note. [place]

## 2018-07-07 NOTE — Telephone Encounter (Signed)
Please take care of this or if he can wait until Dr. Redmond School returns that is fine.

## 2018-07-07 NOTE — Progress Notes (Signed)
Chief Complaint  Patient presents with  . other    sore in both sides of nose , itching eyes, and pressure in face started over a week ago.     Subjective:  Jay Delacruz is a 62 y.o. male who presents for a one week history of frontal headache, sinus pain, itchy, watery eyes, purulent nasal drainage, and sores on the inside of his nostrils bilaterally.  Reports this morning his eyelashes were matted together with green thick discharge.  States left eye is irritated.  Denies vision changes, eye pain or foreign body sensation.  Denies underlying allergies.  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  Denies fever, chills, tinnitus, dizziness, sore throat, cough, chest pain, shortness of breath, abdominal pain, nausea, vomiting, diarrhea.  Treatment to date: saline nasal spray .  Denies sick contacts.  No other aggravating or relieving factors.  No other c/o.  ROS as in subjective.   Objective: Vitals:   07/07/18 0836  BP: (!) 146/88  Pulse: 78  Temp: 97.6 F (36.4 C)  SpO2: 97%    General appearance: Alert, WD/WN, no distress, mildly ill appearing                             Skin: warm, no rash                           Head: + maxillary sinus tenderness                            Eyes: conjunctiva injected on left, normal on right, clear drainage bilaterally, corneas clear, PERRLA. EOMs intact.                             Ears: pearly TMs, external ear canals normal                          Nose: septum midline, turbinates swollen, with erythema, small ulceration to anterior turbinate on right and thick discharge             Mouth/throat: MMM, tongue normal, mild pharyngeal erythema                           Neck: supple, no adenopathy, no thyromegaly, nontender                          Heart: RRR, normal S1, S2, no murmurs                         Lungs: CTA bilaterally, no wheezes, rales, or rhonchi      Assessment: Acute non-recurrent maxillary sinusitis - Plan:  amoxicillin-clavulanate (AUGMENTIN) 875-125 MG tablet  Sore in nose - Plan: mupirocin ointment (BACTROBAN) 2 %  Acute conjunctivitis of both eyes, unspecified acute conjunctivitis type - Plan: trimethoprim-polymyxin b (POLYTRIM) ophthalmic solution    Plan: Discussed diagnosis and treatment of acute sinusitis, conjunctivitis.  Amoxicillin and Polytrim prescribed.  I am also prescribing Bactroban ointment for him to use in his nares due to irritation and history of MRSA.  He will return if getting worse or not improving.

## 2018-07-11 ENCOUNTER — Other Ambulatory Visit: Payer: Self-pay | Admitting: Family Medicine

## 2018-07-11 DIAGNOSIS — E785 Hyperlipidemia, unspecified: Secondary | ICD-10-CM

## 2018-07-15 ENCOUNTER — Telehealth: Payer: Self-pay

## 2018-07-15 DIAGNOSIS — K080 Exfoliation of teeth due to systemic causes: Secondary | ICD-10-CM | POA: Diagnosis not present

## 2018-07-15 DIAGNOSIS — E291 Testicular hypofunction: Secondary | ICD-10-CM

## 2018-07-15 NOTE — Telephone Encounter (Signed)
Pt was advised by you to increase his testosterone to q 7 days and his current script say q 14 days . Please send in new script for q 7 days into [facility] [address] [facility] [address]. Thanks [employer]

## 2018-07-16 MED ORDER — TESTOSTERONE CYPIONATE 200 MG/ML IM SOLN: INTRAMUSCULAR | 5 refills | Status: DC

## 2018-08-01 ENCOUNTER — Encounter: Payer: Self-pay | Admitting: Family Medicine

## 2018-08-01 ENCOUNTER — Telehealth: Payer: Self-pay | Admitting: Family Medicine

## 2018-08-01 ENCOUNTER — Ambulatory Visit: Payer: Federal, State, Local not specified - PPO | Admitting: Family Medicine

## 2018-08-01 VITALS — BP 124/80 | HR 71 | Temp 97.8°F | Wt 286.8 lb

## 2018-08-01 DIAGNOSIS — J0100 Acute maxillary sinusitis, unspecified: Secondary | ICD-10-CM | POA: Diagnosis not present

## 2018-08-01 DIAGNOSIS — E291 Testicular hypofunction: Secondary | ICD-10-CM

## 2018-08-01 MED ORDER — AMOXICILLIN-POT CLAVULANATE 875-125 MG PO TABS: 1.0000 | ORAL_TABLET | Freq: Two times a day (BID) | ORAL | 0 refills | Status: DC

## 2018-08-01 NOTE — Progress Notes (Signed)
   Subjective:    Patient ID: Jay Delacruz, male    DOB: [birth year], 62 y.o.   MRN: [id number]  HPI He is here for a recheck.  He was taking testosterone shots every 2 weeks however his trough level was quite low.  He is now taking it on a 7-day cycle.  He is in his trough today.  He also states that he had a sinus infection and is feeling much better but still having a lot of sinus pressure.   Review of Systems     Objective:   Physical Exam Alert and in no distress otherwise not examined       Assessment & Plan:  Hypogonadism male - Plan: Testosterone  Acute non-recurrent maxillary sinusitis - Plan: amoxicillin-clavulanate (AUGMENTIN) 875-125 MG tablet Since he still having some sinusitis type symptoms, I will give him another 10 days worth of the antibiotic and see if that will clearing up.  If not he is to return for further evaluation.

## 2018-08-01 NOTE — Telephone Encounter (Signed)
Left message for pt. JCL states pt needs to return for singles shot.

## 2018-08-02 LAB — TESTOSTERONE: TESTOSTERONE: 125 ng/dL — AB (ref 264–916)

## 2018-08-05 ENCOUNTER — Other Ambulatory Visit (INDEPENDENT_AMBULATORY_CARE_PROVIDER_SITE_OTHER): Payer: Federal, State, Local not specified - PPO

## 2018-08-05 DIAGNOSIS — Z23 Encounter for immunization: Secondary | ICD-10-CM | POA: Diagnosis not present

## 2018-08-29 ENCOUNTER — Telehealth: Payer: Self-pay | Admitting: Family Medicine

## 2018-08-29 DIAGNOSIS — E291 Testicular hypofunction: Secondary | ICD-10-CM

## 2018-08-29 MED ORDER — TESTOSTERONE CYPIONATE 200 MG/ML IM SOLN: INTRAMUSCULAR | 5 refills | Status: DC

## 2018-08-29 NOTE — Telephone Encounter (Signed)
Needs new rx sent to pharmacy for testosterone With the new/updated directions He is now on 1 1/2 ml not 1 ml  [place]

## 2018-09-30 ENCOUNTER — Other Ambulatory Visit: Payer: Federal, State, Local not specified - PPO

## 2018-09-30 ENCOUNTER — Other Ambulatory Visit: Payer: Self-pay

## 2018-09-30 DIAGNOSIS — R7989 Other specified abnormal findings of blood chemistry: Secondary | ICD-10-CM

## 2018-10-01 LAB — TESTOSTERONE: Testosterone: 418 ng/dL (ref 264–916)

## 2018-10-02 DIAGNOSIS — L239 Allergic contact dermatitis, unspecified cause: Secondary | ICD-10-CM | POA: Diagnosis not present

## 2018-10-09 ENCOUNTER — Other Ambulatory Visit: Payer: Self-pay | Admitting: Family Medicine

## 2018-10-09 DIAGNOSIS — E785 Hyperlipidemia, unspecified: Secondary | ICD-10-CM

## 2018-10-13 ENCOUNTER — Other Ambulatory Visit (INDEPENDENT_AMBULATORY_CARE_PROVIDER_SITE_OTHER): Payer: Federal, State, Local not specified - PPO

## 2018-10-13 DIAGNOSIS — Z23 Encounter for immunization: Secondary | ICD-10-CM | POA: Diagnosis not present

## 2018-10-16 DIAGNOSIS — K080 Exfoliation of teeth due to systemic causes: Secondary | ICD-10-CM | POA: Diagnosis not present

## 2018-11-14 ENCOUNTER — Other Ambulatory Visit: Payer: Self-pay | Admitting: Family Medicine

## 2018-11-14 DIAGNOSIS — E785 Hyperlipidemia, unspecified: Secondary | ICD-10-CM

## 2018-11-14 DIAGNOSIS — M1000 Idiopathic gout, unspecified site: Secondary | ICD-10-CM

## 2018-11-14 DIAGNOSIS — I10 Essential (primary) hypertension: Secondary | ICD-10-CM

## 2018-11-14 NOTE — Telephone Encounter (Signed)
Pt was called due to CPE needing to be sch. LVM. [place]

## 2018-11-28 ENCOUNTER — Telehealth: Payer: Self-pay | Admitting: Family Medicine

## 2018-11-28 NOTE — Telephone Encounter (Signed)
P.A. TESTOSTERONE CYPIONATE  

## 2018-12-06 NOTE — Telephone Encounter (Signed)
P.A. approved til 11/28/19, left message for pt

## 2018-12-08 ENCOUNTER — Encounter: Payer: Self-pay | Admitting: Family Medicine

## 2018-12-08 ENCOUNTER — Ambulatory Visit: Payer: Federal, State, Local not specified - PPO | Admitting: Family Medicine

## 2018-12-08 VITALS — BP 142/90 | HR 76 | Temp 97.9°F | Wt 281.2 lb

## 2018-12-08 DIAGNOSIS — M1000 Idiopathic gout, unspecified site: Secondary | ICD-10-CM | POA: Diagnosis not present

## 2018-12-08 MED ORDER — COLCHICINE 0.6 MG PO TABS: 0.6000 mg | ORAL_TABLET | Freq: Two times a day (BID) | ORAL | 0 refills | Status: DC | PRN

## 2018-12-08 NOTE — Progress Notes (Signed)
   Subjective:    Patient ID: Jay Delacruz, male    DOB: 19-May-1956, 62 y.o.   MRN: [id number]  HPI He is here for consult concerning possible gout of the left ankle.  He does have a previous history of gout and presently is on allopurinol.  He has not taken any medication for this.  He states that his last attack was well over a year ago.   Review of Systems     Objective:   Physical Exam Alert and in no distress.  Exam of the left ankle shows no erythema, warmth, tenderness to palpation.  There is some slight pain on motion of the ankle.      Assessment & Plan:  Idiopathic gout, unspecified chronicity, unspecified site - Plan: colchicine 0.6 MG tablet He will return here in 1 month for recheck.  I will be able to check his uric acid level at that time.  Did give him colchicine but also recommend that he use 2 Aleve twice per day as well as the colchicine. He did have an FMLA form for his gout but I explained that since he has not had any attack in over a year and is on medication, it would not be appropriate.

## 2018-12-08 NOTE — Patient Instructions (Signed)
The next time you have a gout outbreak immediately start taking 2 Aleve twice per day.  If you do not get relief then go ahead and start taking the colchicine twice per day

## 2019-01-07 ENCOUNTER — Encounter: Payer: Self-pay | Admitting: Family Medicine

## 2019-01-07 ENCOUNTER — Ambulatory Visit: Payer: Federal, State, Local not specified - PPO | Admitting: Family Medicine

## 2019-01-07 VITALS — BP 142/90 | HR 66 | Temp 97.9°F | Ht 72.0 in | Wt 289.8 lb

## 2019-01-07 DIAGNOSIS — Z87891 Personal history of nicotine dependence: Secondary | ICD-10-CM

## 2019-01-07 DIAGNOSIS — E291 Testicular hypofunction: Secondary | ICD-10-CM | POA: Diagnosis not present

## 2019-01-07 DIAGNOSIS — K219 Gastro-esophageal reflux disease without esophagitis: Secondary | ICD-10-CM | POA: Diagnosis not present

## 2019-01-07 DIAGNOSIS — Z0000 Encounter for general adult medical examination without abnormal findings: Secondary | ICD-10-CM

## 2019-01-07 DIAGNOSIS — Z800 Family history of malignant neoplasm of digestive organs: Secondary | ICD-10-CM

## 2019-01-07 DIAGNOSIS — E785 Hyperlipidemia, unspecified: Secondary | ICD-10-CM

## 2019-01-07 DIAGNOSIS — I10 Essential (primary) hypertension: Secondary | ICD-10-CM

## 2019-01-07 DIAGNOSIS — E669 Obesity, unspecified: Secondary | ICD-10-CM | POA: Diagnosis not present

## 2019-01-07 DIAGNOSIS — Z125 Encounter for screening for malignant neoplasm of prostate: Secondary | ICD-10-CM | POA: Diagnosis not present

## 2019-01-07 DIAGNOSIS — M1000 Idiopathic gout, unspecified site: Secondary | ICD-10-CM | POA: Diagnosis not present

## 2019-01-07 DIAGNOSIS — M1990 Unspecified osteoarthritis, unspecified site: Secondary | ICD-10-CM

## 2019-01-07 LAB — POCT URINALYSIS DIP (PROADVANTAGE DEVICE)
Bilirubin, UA: NEGATIVE
Blood, UA: NEGATIVE
Glucose, UA: NEGATIVE mg/dL
Ketones, POC UA: NEGATIVE mg/dL
Leukocytes, UA: NEGATIVE
Nitrite, UA: NEGATIVE
Protein Ur, POC: NEGATIVE mg/dL
Specific Gravity, Urine: 1.025
Urobilinogen, Ur: 3.5
pH, UA: 6 (ref 5.0–8.0)

## 2019-01-07 MED ORDER — LOSARTAN POTASSIUM-HCTZ 100-12.5 MG PO TABS: 1.0000 | ORAL_TABLET | Freq: Every day | ORAL | 0 refills | Status: DC

## 2019-01-07 MED ORDER — AMLODIPINE BESYLATE 5 MG PO TABS: 5.0000 mg | ORAL_TABLET | Freq: Every day | ORAL | 3 refills | Status: DC

## 2019-01-07 MED ORDER — IBUPROFEN 800 MG PO TABS: 800.0000 mg | ORAL_TABLET | Freq: Three times a day (TID) | ORAL | 1 refills | Status: DC | PRN

## 2019-01-07 MED ORDER — ALLOPURINOL 300 MG PO TABS: 300.0000 mg | ORAL_TABLET | Freq: Every day | ORAL | 3 refills | Status: DC

## 2019-01-07 MED ORDER — ATORVASTATIN CALCIUM 40 MG PO TABS: 40.0000 mg | ORAL_TABLET | Freq: Every day | ORAL | 3 refills | Status: DC

## 2019-01-07 NOTE — Progress Notes (Signed)
Subjective:    Patient ID: Jay Delacruz, male    DOB: Apr 23, 1956, 63 y.o.   MRN: [id number]  HPI He is here for complete examination.  He continues on his allopurinol is having no difficulty with that.  He is also taking atorvastatin for his lipids, losartan/HCTZ for blood pressure.  He does occasionally use Prilosec for his reflux symptoms.  Continues on testosterone and getting good results with that.  He is a former smoker.  He now indicates that there is a family history of colon cancer with his mother and father both having cancer.  He would like ibuprofen called him to help with arthritis mainly of his knees.  His weight is unchanged.  He does use the treadmill.  His work is going well.  He is in his second marriage and this seems to be going well.  Family and social history as well as health maintenance and immunizations was reviewed   Review of Systems     Objective:   Physical Exam BP (!) 142/90 (BP Location: Left Arm, Patient Position: Sitting)   Pulse 66   Temp 97.9 F (36.6 C)   Ht 6' (1.829 m)   Wt 289 lb 12.8 oz (131.5 kg)   SpO2 94%   BMI 39.30 kg/m   General Appearance:    Alert, cooperative, no distress, appears stated age  Head:    Normocephalic, without obvious abnormality, atraumatic  Eyes:    PERRL, conjunctiva/corneas clear, EOM's intact, fundi    benign  Ears:    Normal TM's and external ear canals  Nose:   Nares normal, mucosa normal, no drainage or sinus   tenderness  Throat:   Lips, mucosa, and tongue normal; teeth and gums normal  Neck:   Supple, no lymphadenopathy;  thyroid:  no   enlargement/tenderness/nodules; no carotid   bruit or JVD     Lungs:     Clear to auscultation bilaterally without wheezes, rales or     ronchi; respirations unlabored      Heart:    Regular rate and rhythm, S1 and S2 normal, no murmur, rub   or gallop     Abdomen:     Soft, non-tender, nondistended, normoactive bowel sounds,    no masses, no hepatosplenomegaly    Genitalia:   Deferred  Rectal:   Deferred  Extremities:   No clubbing, cyanosis or edema  Pulses:   2+ and symmetric all extremities  Skin:   Skin color, texture, turgor normal, no rashes or lesions  Lymph nodes:   Cervical, supraclavicular, and axillary nodes normal  Neurologic:   CNII-XII intact, normal strength, sensation and gait; reflexes 2+ and symmetric throughout          Psych:   Normal mood, affect, hygiene and grooming.           Assessment & Plan:  Routine general medical examination at a health care facility - Plan: CBC with Differential/Platelet, Comprehensive metabolic panel, Lipid panel, POCT Urinalysis DIP (Proadvantage Device)  Family history of colon cancer - Plan: Ambulatory referral to Gastroenterology  Gastroesophageal reflux disease, esophagitis presence not specified  Former smoker  Idiopathic gout, unspecified chronicity, unspecified site - Plan: Uric Acid, allopurinol (ZYLOPRIM) 300 MG tablet  Hyperlipidemia with target LDL less than 100 - Plan: Lipid panel, atorvastatin (LIPITOR) 40 MG tablet  Essential hypertension - Plan: amLODipine (NORVASC) 5 MG tablet, losartan-hydrochlorothiazide (HYZAAR) 100-12.5 MG tablet  Hypogonadism male - Plan: CBC with Differential/Platelet, Comprehensive metabolic panel  Obesity (  BMI 30-39.9) - Plan: CBC with Differential/Platelet, Comprehensive metabolic panel, Lipid panel  Screening for prostate cancer - Plan: PSA, PSA  Arthritis - Plan: ibuprofen (ADVIL,MOTRIN) 800 MG tablet His medications were renewed.  I did add amlodipine to his regimen and he is to return here in 1 month for follow-up on that.  We will also give him ibuprofen for his various aches and pains. Again discussed the need for him to make diet and exercise changes however past history indicates that this will probably not happen.

## 2019-01-08 LAB — LIPID PANEL
Chol/HDL Ratio: 4.2 ratio (ref 0.0–5.0)
Cholesterol, Total: 157 mg/dL (ref 100–199)
HDL: 37 mg/dL — ABNORMAL LOW (ref >39)
LDL Calculated: 49 mg/dL (ref 0–99)
Triglycerides: 353 mg/dL — ABNORMAL HIGH (ref 0–149)
VLDL Cholesterol Cal: 71 mg/dL — ABNORMAL HIGH (ref 5–40)

## 2019-01-08 LAB — CBC WITH DIFFERENTIAL/PLATELET
Basophils Absolute: 0 10*3/uL (ref 0.0–0.2)
Basos: 1 %
EOS (ABSOLUTE): 0.1 10*3/uL (ref 0.0–0.4)
Eos: 3 %
Hematocrit: 45.5 % (ref 37.5–51.0)
Hemoglobin: 14.6 g/dL (ref 13.0–17.7)
IMMATURE GRANS (ABS): 0 10*3/uL (ref 0.0–0.1)
Immature Granulocytes: 0 %
LYMPHS: 39 %
Lymphocytes Absolute: 1.7 10*3/uL (ref 0.7–3.1)
MCH: 26.2 pg — ABNORMAL LOW (ref 26.6–33.0)
MCHC: 32.1 g/dL (ref 31.5–35.7)
MCV: 82 fL (ref 79–97)
Monocytes Absolute: 0.5 10*3/uL (ref 0.1–0.9)
Monocytes: 10 %
Neutrophils Absolute: 2.1 10*3/uL (ref 1.4–7.0)
Neutrophils: 47 %
Platelets: 197 10*3/uL (ref 150–450)
RBC: 5.57 x10E6/uL (ref 4.14–5.80)
RDW: 17.1 % — ABNORMAL HIGH (ref 11.6–15.4)
WBC: 4.4 10*3/uL (ref 3.4–10.8)

## 2019-01-08 LAB — PSA: Prostate Specific Ag, Serum: 0.5 ng/mL (ref 0.0–4.0)

## 2019-01-08 LAB — URIC ACID: URIC ACID: 6.1 mg/dL (ref 3.7–8.6)

## 2019-01-08 LAB — COMPREHENSIVE METABOLIC PANEL
ALT: 14 IU/L (ref 0–44)
AST: 18 IU/L (ref 0–40)
Albumin/Globulin Ratio: 2 (ref 1.2–2.2)
Albumin: 4.3 g/dL (ref 3.6–4.8)
Alkaline Phosphatase: 59 IU/L (ref 39–117)
BUN/Creatinine Ratio: 12 (ref 10–24)
BUN: 9 mg/dL (ref 8–27)
Bilirubin Total: 0.5 mg/dL (ref 0.0–1.2)
CALCIUM: 10 mg/dL (ref 8.6–10.2)
CO2: 22 mmol/L (ref 20–29)
Chloride: 100 mmol/L (ref 96–106)
Creatinine, Ser: 0.78 mg/dL (ref 0.76–1.27)
GFR calc Af Amer: 113 mL/min/{1.73_m2} (ref >59)
GFR, EST NON AFRICAN AMERICAN: 98 mL/min/{1.73_m2} (ref >59)
GLUCOSE: 91 mg/dL (ref 65–99)
Globulin, Total: 2.2 g/dL (ref 1.5–4.5)
Potassium: 5 mmol/L (ref 3.5–5.2)
Sodium: 138 mmol/L (ref 134–144)
Total Protein: 6.5 g/dL (ref 6.0–8.5)

## 2019-01-12 ENCOUNTER — Encounter: Payer: Self-pay | Admitting: Family Medicine

## 2019-01-12 ENCOUNTER — Ambulatory Visit (INDEPENDENT_AMBULATORY_CARE_PROVIDER_SITE_OTHER): Payer: Federal, State, Local not specified - PPO | Admitting: Family Medicine

## 2019-01-12 VITALS — BP 170/100 | HR 63 | Temp 97.6°F | Wt 292.4 lb

## 2019-01-12 DIAGNOSIS — E785 Hyperlipidemia, unspecified: Secondary | ICD-10-CM | POA: Diagnosis not present

## 2019-01-12 DIAGNOSIS — E781 Pure hyperglyceridemia: Secondary | ICD-10-CM | POA: Diagnosis not present

## 2019-01-12 MED ORDER — ICOSAPENT ETHYL 1 G PO CAPS: 2.0000 | ORAL_CAPSULE | Freq: Two times a day (BID) | ORAL | 3 refills | Status: DC

## 2019-01-12 NOTE — Progress Notes (Signed)
   Subjective:    Patient ID: Jay Delacruz, male    DOB: 02-21-1956, 63 y.o.   MRN: [id number]  HPI He is here for follow-up visit concerning recent blood work which did show an elevated triglycerides.  Presently he is on a statin as well as Lovaza.  He does state that his eating habits have been less than optimal.   Review of Systems     Objective:   Physical Exam Alert and in no distress otherwise not examined       Assessment & Plan:  Hypertriglyceridemia - Plan: Icosapent Ethyl (VASCEPA) 1 g CAPS  Hyperlipidemia with target LDL less than 100 - Plan: Icosapent Ethyl (VASCEPA) 1 g CAPS I will have him stop the Lovaza and switch to Vascepa.  Again discussed the need for him to cut back on alcohol consumption as well as carbohydrates.  Discussed the cardiovascular as well as diabetes issues associated with hypertriglyceridemia.  He will return here in March for recheck.

## 2019-01-14 ENCOUNTER — Telehealth: Payer: Self-pay

## 2019-01-14 NOTE — Telephone Encounter (Signed)
Called pt to advise him to call back to [place] to schedule his appt. Info that they have requested was faxed over. [place]

## 2019-01-16 ENCOUNTER — Telehealth: Payer: Self-pay | Admitting: Gastroenterology

## 2019-01-16 NOTE — Telephone Encounter (Signed)
Okay to schedule colonoscopy These make sure he satisfies L[facility] criteria

## 2019-01-16 NOTE — Telephone Encounter (Signed)
Hi Dr. Lyndel Safe, we have received a referral from pt's PCP for a repeat colon. Pt had colon 5 years ago at [facility]. We received records and will be placed on your desk for review. Please advise on scheduling. Thank you.

## 2019-01-19 NOTE — Telephone Encounter (Signed)
Called pt to schedule proc, he needs morning appt. We will call him when March calendar is available.

## 2019-01-20 ENCOUNTER — Telehealth: Payer: Self-pay | Admitting: Internal Medicine

## 2019-01-20 NOTE — Telephone Encounter (Signed)
Pt called and states that he went to pharmacy to get the fish oil med that was sent in and it was over $ 500.00 and he can't afford that. Please advise. Pt uses [facility] [place] [address]/ [address]

## 2019-01-20 NOTE — Telephone Encounter (Signed)
See if you could work with him to get it cheaper

## 2019-01-26 ENCOUNTER — Telehealth: Payer: Self-pay | Admitting: Family Medicine

## 2019-01-26 NOTE — Telephone Encounter (Signed)
[place] went thru for $500, activated discount card and went down to $232 for 90 day supply & 85 for 30 day supply.  Pt has high deductible.  Left message for pt.

## 2019-01-26 NOTE — Telephone Encounter (Signed)
[place] went thru for $500, activated discount card and went down to $2232 for 90 day supply & 85 for 30 day supply.  Pt has high deductible.  Left message for pt.

## 2019-01-28 NOTE — Telephone Encounter (Signed)
Left message to call back  

## 2019-02-02 ENCOUNTER — Telehealth: Payer: Self-pay | Admitting: Family Medicine

## 2019-02-02 NOTE — Telephone Encounter (Signed)
Rep came by & advised VASCEPA discount card increased today so I called pharmacy and went thru for $112 for 90 days, called & left message for pt

## 2019-02-03 ENCOUNTER — Encounter: Payer: Self-pay | Admitting: Gastroenterology

## 2019-02-12 ENCOUNTER — Ambulatory Visit (AMBULATORY_SURGERY_CENTER): Payer: Self-pay | Admitting: *Deleted

## 2019-02-12 ENCOUNTER — Encounter: Payer: Self-pay | Admitting: Gastroenterology

## 2019-02-12 ENCOUNTER — Ambulatory Visit: Payer: Self-pay | Admitting: Family Medicine

## 2019-02-12 VITALS — Ht 72.0 in | Wt 287.0 lb

## 2019-02-12 DIAGNOSIS — Z800 Family history of malignant neoplasm of digestive organs: Secondary | ICD-10-CM

## 2019-02-12 MED ORDER — NA SULFATE-K SULFATE-MG SULF 17.5-3.13-1.6 GM/177ML PO SOLN: 1.0000 | Freq: Once | ORAL | 0 refills | Status: AC

## 2019-02-12 NOTE — Progress Notes (Signed)
No egg or soy allergy known to patient  No issues with past sedation with any surgeries  or procedures, no intubation problems - chart states pt has a loose tooth bottom front, pt denies this in PV today  No diet pills per patient No home 02 use per patient  No blood thinners per patient  Pt denies issues with constipation  No A fib or A flutter  EMMI video sent to pt's e mail -pt declined  Suprep $15 coupon to pt

## 2019-02-17 ENCOUNTER — Ambulatory Visit: Payer: Self-pay | Admitting: Family Medicine

## 2019-02-25 ENCOUNTER — Encounter: Payer: Self-pay | Admitting: Gastroenterology

## 2019-02-25 ENCOUNTER — Ambulatory Visit (AMBULATORY_SURGERY_CENTER): Payer: Federal, State, Local not specified - PPO | Admitting: Gastroenterology

## 2019-02-25 VITALS — BP 123/80 | HR 69 | Temp 96.2°F | Resp 17 | Ht 72.0 in | Wt 287.0 lb

## 2019-02-25 DIAGNOSIS — Z86010 Personal history of colonic polyps: Secondary | ICD-10-CM | POA: Diagnosis not present

## 2019-02-25 DIAGNOSIS — D123 Benign neoplasm of transverse colon: Secondary | ICD-10-CM | POA: Diagnosis not present

## 2019-02-25 DIAGNOSIS — Z1211 Encounter for screening for malignant neoplasm of colon: Secondary | ICD-10-CM | POA: Diagnosis not present

## 2019-02-25 DIAGNOSIS — Z800 Family history of malignant neoplasm of digestive organs: Secondary | ICD-10-CM | POA: Diagnosis not present

## 2019-02-25 HISTORY — PX: COLONOSCOPY WITH PROPOFOL: SHX5780

## 2019-02-25 MED ORDER — SODIUM CHLORIDE 0.9 % IV SOLN: 500.0000 mL | Freq: Once | INTRAVENOUS | Status: DC

## 2019-02-25 NOTE — Progress Notes (Signed)
Called to room to assist during endoscopic procedure.  Patient ID and intended procedure confirmed with present staff. Received instructions for my participation in the procedure from the performing physician.  

## 2019-02-25 NOTE — Progress Notes (Signed)
Pt awake. VSS. Report given to RN. No anesthetic complications noted 

## 2019-02-25 NOTE — Progress Notes (Signed)
No problems noted in the recovery room. maw 

## 2019-02-25 NOTE — Progress Notes (Signed)
Pt's states no medical or surgical changes since previsit or office visit. 

## 2019-02-25 NOTE — Patient Instructions (Signed)
YOU HAD AN ENDOSCOPIC PROCEDURE TODAY AT [facility]:   Refer to the procedure report that was given to you for any specific questions about what was found during the examination.  If the procedure report does not answer your questions, please call your gastroenterologist to clarify.  If you requested that your care partner not be given the details of your procedure findings, then the procedure report has been included in a sealed envelope for you to review at your convenience later.  YOU SHOULD EXPECT: Some feelings of bloating in the abdomen. Passage of more gas than usual.  Walking can help get rid of the air that was put into your GI tract during the procedure and reduce the bloating. If you had a lower endoscopy (such as a colonoscopy or flexible sigmoidoscopy) you may notice spotting of blood in your stool or on the toilet paper. If you underwent a bowel prep for your procedure, you may not have a normal bowel movement for a few days.  Please Note:  You might notice some irritation and congestion in your nose or some drainage.  This is from the oxygen used during your procedure.  There is no need for concern and it should clear up in a day or so.  SYMPTOMS TO REPORT IMMEDIATELY:   Following lower endoscopy (colonoscopy or flexible sigmoidoscopy):  Excessive amounts of blood in the stool  Significant tenderness or worsening of abdominal pains  Swelling of the abdomen that is new, acute  Fever of 100F or higher   For urgent or emergent issues, a gastroenterologist can be reached at any hour by calling [phone].   DIET:  We do recommend a small meal at first, but then you may proceed to your regular diet.  Drink plenty of fluids but you should avoid alcoholic beverages for 24 hours.  ACTIVITY:  You should plan to take it easy for the rest of today and you should NOT DRIVE or use heavy machinery until tomorrow (because of the sedation medicines used during the test).     FOLLOW UP: Our staff will call the number listed on your records the next business day following your procedure to check on you and address any questions or concerns that you may have regarding the information given to you following your procedure. If we do not reach you, we will leave a message.  However, if you are feeling well and you are not experiencing any problems, there is no need to return our call.  We will assume that you have returned to your regular daily activities without incident.  If any biopsies were taken you will be contacted by phone or by letter within the next 1-3 weeks.  Please call us at [phone] if you have not heard about the biopsies in 3 weeks.    SIGNATURES/CONFIDENTIALITY: You and/or your care partner have signed paperwork which will be entered into your electronic medical record.  These signatures attest to the fact that that the information above on your After Visit Summary has been reviewed and is understood.  Full responsibility of the confidentiality of this discharge information lies with you and/or your care-partner.    Handouts were given to your care partner on polyps, diverticulosis, and hemorrhoids. NO ASPIRIN, ASPIRIN CONTAINING PRODUCTS (BC OR GOODY POWDERS) OR NSAIDS (IBUPROFEN, ADVIL, ALEVE, AND MOTRIN) FOR 5 days; TYLENOL IS OK TO TAKE You may resume your other current medications today. Await biopsy results. Please call if any questions or  concerns.

## 2019-02-25 NOTE — Op Note (Signed)
[place] Patient Name: Jay Delacruz Procedure Date: 02/25/2019 8:28 AM MRN: [id number] Endoscopist: Jackquline Denmark , MD Age: 63 Referring MD:  Date of Birth: August 27, 1956 Gender: Male Account #: [id number] Procedure:                Colonoscopy Indications:              Screening patient at increased risk: Family history                            of two 1st-degree relatives with colorectal cancer                            at age 40 years (mother and father) (or older) Medicines:                Monitored Anesthesia Care Procedure:                Pre-Anesthesia Assessment:                           - Prior to the procedure, a History and Physical                            was performed, and patient medications and                            allergies were reviewed. The patient's tolerance of                            previous anesthesia was also reviewed. The risks                            and benefits of the procedure and the sedation                            options and risks were discussed with the patient.                            All questions were answered, and informed consent                            was obtained. Prior Anticoagulants: The patient has                            taken no previous anticoagulant or antiplatelet                            agents. ASA Grade Assessment: II - A patient with                            mild systemic disease. After reviewing the risks                            and benefits, the patient was deemed in  satisfactory condition to undergo the procedure.                           After obtaining informed consent, the colonoscope                            was passed under direct vision. Throughout the                            procedure, the patient's blood pressure, pulse, and                            oxygen saturations were monitored continuously. The                            Colonoscope was  introduced through the anus and                            advanced to the 2 cm into the ileum. The                            colonoscopy was performed without difficulty. The                            patient tolerated the procedure well. The quality                            of the bowel preparation was excellent. The                            terminal ileum, ileocecal valve, appendiceal                            orifice, and rectum were photographed. Scope In: 8:40:54 AM Scope Out: 8:52:55 AM Scope Withdrawal Time: 0 hours 8 minutes 46 seconds  Total Procedure Duration: 0 hours 12 minutes 1 second  Findings:                 A 10 mm polyp was found in the proximal transverse                            colon. The polyp was semi-pedunculated. The polyp                            was removed with a hot snare. Resection and                            retrieval were complete. Estimated blood loss: none.                           A few small-mouthed diverticula were found in the                            sigmoid colon and ascending colon.  Non-bleeding internal hemorrhoids were found during                            retroflexion. The hemorrhoids were small.                           The exam was otherwise without abnormality on                            direct and retroflexion views.                           The terminal ileum appeared normal. Complications:            No immediate complications. Estimated Blood Loss:     Estimated blood loss: none. Impression:               -One 10 mm polyp in the proximal transverse colon,                            removed with a hot snare. Resected and retrieved.                           -Minimal pancolonic diverticulosis.                           -Otherwise normal colonoscopy to TI. Recommendation:           - Patient has a contact number available for                            emergencies. The signs and symptoms of  potential                            delayed complications were discussed with the                            patient. Return to normal activities tomorrow.                            Written discharge instructions were provided to the                            patient.                           - Resume previous diet.                           - Continue present medications.                           - Await pathology results.                           - No aspirin, ibuprofen, naproxen, or other  non-steroidal anti-inflammatory drugs for 5 days                            after polyp removal.                           - Repeat colonoscopy for surveillance based on                            pathology results.                           - Return to GI clinic PRN. Jackquline Denmark, MD 02/25/2019 8:58:30 AM This report has been signed electronically.

## 2019-02-26 ENCOUNTER — Telehealth: Payer: Self-pay

## 2019-02-26 NOTE — Telephone Encounter (Signed)
  Follow up Call-  Call back number 02/25/2019  Post procedure Call Back phone  # [phone]  Permission to leave phone message Yes  Some recent data might be hidden     Patient questions:  Do you have a fever, pain , or abdominal swelling? No. Pain Score  0 *  Have you tolerated food without any problems? Yes.    Have you been able to return to your normal activities? Yes.    Do you have any questions about your discharge instructions: Diet   No. Medications  No. Follow up visit  No.  Do you have questions or concerns about your Care? No.  Actions: * If pain score is 4 or above: No action needed, pain <4.  No problems noted per pt. maw

## 2019-02-27 ENCOUNTER — Other Ambulatory Visit: Payer: Self-pay | Admitting: Family Medicine

## 2019-02-27 DIAGNOSIS — E291 Testicular hypofunction: Secondary | ICD-10-CM

## 2019-02-27 NOTE — Telephone Encounter (Signed)
[facility] is requesting to fill pt testosterone. Please advise [facility] 

## 2019-03-05 ENCOUNTER — Encounter: Payer: Self-pay | Admitting: Gastroenterology

## 2019-03-27 ENCOUNTER — Other Ambulatory Visit: Payer: Self-pay | Admitting: Family Medicine

## 2019-05-22 ENCOUNTER — Other Ambulatory Visit: Payer: Self-pay | Admitting: Family Medicine

## 2019-05-22 DIAGNOSIS — E291 Testicular hypofunction: Secondary | ICD-10-CM

## 2019-05-22 NOTE — Telephone Encounter (Signed)
Pt has appt Monday and will not be out [facility]

## 2019-05-22 NOTE — Telephone Encounter (Signed)
Pt has an appt Monday and will not be out before visit. [place]

## 2019-05-22 NOTE — Telephone Encounter (Signed)
He needs an appointment but do not let him run out

## 2019-05-22 NOTE — Telephone Encounter (Signed)
[facility]. Please advise [facility]

## 2019-05-25 ENCOUNTER — Other Ambulatory Visit: Payer: Self-pay

## 2019-05-25 ENCOUNTER — Encounter: Payer: Self-pay | Admitting: Family Medicine

## 2019-05-25 ENCOUNTER — Ambulatory Visit: Payer: Federal, State, Local not specified - PPO | Admitting: Family Medicine

## 2019-05-25 VITALS — BP 120/82 | HR 88 | Temp 98.4°F | Wt 280.2 lb

## 2019-05-25 DIAGNOSIS — E785 Hyperlipidemia, unspecified: Secondary | ICD-10-CM

## 2019-05-25 DIAGNOSIS — Z125 Encounter for screening for malignant neoplasm of prostate: Secondary | ICD-10-CM | POA: Diagnosis not present

## 2019-05-25 DIAGNOSIS — E291 Testicular hypofunction: Secondary | ICD-10-CM | POA: Diagnosis not present

## 2019-05-25 NOTE — Addendum Note (Signed)
Addended by: Denita Lung on: 05/25/2019 09:59 AM   Modules accepted: Orders

## 2019-05-25 NOTE — Progress Notes (Signed)
   Subjective:    Patient ID: Jay Delacruz, male    DOB: Oct 16, 1956, 63 y.o.   MRN: [id number]  HPI He is here for recheck.  He is now taking Vascepa but only 2 pills/day stating he has had unacceptable side effects of muscle aches and pains.  He also started walking 3 miles per day.  Continues on testosterone and is having no difficulty with that.   Review of Systems     Objective:   Physical Exam Alert and in no distress otherwise not examined       Assessment & Plan:  Hypogonadism male - Plan: Testosterone  Hyperlipidemia with target LDL less than 100 - Plan: Lipid panel  Screening for prostate cancer - Plan: PSA I congratulated him on his walking regimen and the fact that he has lost weight.

## 2019-05-26 LAB — LIPID PANEL
Chol/HDL Ratio: 5.5 ratio — ABNORMAL HIGH (ref 0.0–5.0)
Cholesterol, Total: 186 mg/dL (ref 100–199)
HDL: 34 mg/dL — ABNORMAL LOW (ref >39)
Triglycerides: 826 mg/dL (ref 0–149)

## 2019-05-26 LAB — TESTOSTERONE: Testosterone: 81 ng/dL — ABNORMAL LOW (ref 264–916)

## 2019-05-26 LAB — PSA: Prostate Specific Ag, Serum: 0.2 ng/mL (ref 0.0–4.0)

## 2019-05-26 MED ORDER — FENOFIBRATE 145 MG PO TABS: 145.0000 mg | ORAL_TABLET | Freq: Every day | ORAL | 3 refills | Status: DC

## 2019-05-26 MED ORDER — TESTOSTERONE CYPIONATE 200 MG/ML IM SOLN: INTRAMUSCULAR | 1 refills | Status: DC

## 2019-05-26 NOTE — Addendum Note (Signed)
Addended by: Denita Lung on: 05/26/2019 08:40 AM   Modules accepted: Orders

## 2019-05-26 NOTE — Addendum Note (Signed)
Addended by: Denita Lung on: 05/26/2019 01:06 PM   Modules accepted: Orders

## 2019-09-09 ENCOUNTER — Other Ambulatory Visit: Payer: Self-pay | Admitting: Family Medicine

## 2019-09-09 DIAGNOSIS — E291 Testicular hypofunction: Secondary | ICD-10-CM

## 2019-09-09 NOTE — Telephone Encounter (Signed)
Is this okay to refill? 

## 2019-09-24 ENCOUNTER — Ambulatory Visit
Admission: RE | Admit: 2019-09-24 | Discharge: 2019-09-24 | Disposition: A | Discharge status: Home / Self Care | Payer: Federal, State, Local not specified - PPO | Source: Ambulatory Visit | Attending: Family Medicine | Admitting: Family Medicine

## 2019-09-24 ENCOUNTER — Other Ambulatory Visit: Payer: Self-pay

## 2019-09-24 ENCOUNTER — Ambulatory Visit: Payer: Federal, State, Local not specified - PPO | Admitting: Family Medicine

## 2019-09-24 ENCOUNTER — Encounter: Payer: Self-pay | Admitting: Family Medicine

## 2019-09-24 VITALS — BP 138/84 | HR 76 | Temp 98.4°F | Wt 285.2 lb

## 2019-09-24 DIAGNOSIS — Z23 Encounter for immunization: Secondary | ICD-10-CM | POA: Diagnosis not present

## 2019-09-24 DIAGNOSIS — M50323 Other cervical disc degeneration at C6-C7 level: Secondary | ICD-10-CM | POA: Diagnosis not present

## 2019-09-24 DIAGNOSIS — M1990 Unspecified osteoarthritis, unspecified site: Secondary | ICD-10-CM

## 2019-09-24 MED ORDER — IBUPROFEN 800 MG PO TABS: 800.0000 mg | ORAL_TABLET | Freq: Three times a day (TID) | ORAL | 1 refills | Status: DC | PRN

## 2019-09-24 NOTE — Progress Notes (Signed)
   Subjective:    Patient ID: Karena Addison, male    DOB: 12-08-1956, 63 y.o.   MRN: [id number]  HPI He complains of a one-month history of right-sided neck pain that initially was intermittent and is now constant.  It radiates to the shoulder with some numbness and tingling sensation.  He is right-handed.  He does have a previous history of arthritis mainly in his knees.  No decrease in strength or sensation is noted.  He has been using alternating heat and ice as well as occasionally taking ibuprofen.  He has not had any difficulty with this in the past.  He did take several days off of work because of this.  No previous history of neck trouble.   Review of Systems     Objective:   Physical Exam Alert and in no distress.  Slight limitation of range of motion in all directions.  Negative Spurling test.  Normal motor, sensory and DTRs are noted.       Assessment & Plan:  Need for influenza vaccination - Plan: Flu Vaccine QUAD 6+ mos PF IM (Fluarix Quad PF)  Arthritis - Plan: DG Cervical Spine Complete, ibuprofen (ADVIL) 800 MG tablet Take the ibuprofen 3 times a day for the next couple weeks to see if this will quiet down you can do anything you need .  Heat for 20 minutes 3 times a day would be my choice.  Set up another appointment in a couple, 3 weeks if you are not any better

## 2019-09-24 NOTE — Patient Instructions (Signed)
Take the ibuprofen 3 times a day for the next couple weeks to see if this will quiet down you can do anything you need .  Heat for 20 minutes 3 times a day would be my choice.  Set up another appointment in a couple 3 weeks if you are not any better

## 2019-10-13 ENCOUNTER — Other Ambulatory Visit: Payer: Self-pay

## 2019-10-13 ENCOUNTER — Encounter: Payer: Self-pay | Admitting: Family Medicine

## 2019-10-13 ENCOUNTER — Ambulatory Visit: Payer: Federal, State, Local not specified - PPO | Admitting: Family Medicine

## 2019-10-13 VITALS — BP 172/98 | HR 76 | Temp 98.9°F | Wt 290.4 lb

## 2019-10-13 DIAGNOSIS — M1990 Unspecified osteoarthritis, unspecified site: Secondary | ICD-10-CM

## 2019-10-13 DIAGNOSIS — M5030 Other cervical disc degeneration, unspecified cervical region: Secondary | ICD-10-CM | POA: Diagnosis not present

## 2019-10-13 DIAGNOSIS — M542 Cervicalgia: Secondary | ICD-10-CM | POA: Diagnosis not present

## 2019-10-13 NOTE — Progress Notes (Signed)
   Subjective:    Patient ID: Jay Delacruz, male    DOB: [birth year], 63 y.o.   MRN: [id number]  HPI He is here for recheck on continued difficulty with neck pain especially on the right.  X-rays did show evidence of degenerative changes.  He has been vigilant him using heat, stretching and ibuprofen but continues have difficulty with pain that does radiate occasionally down his right arm.  Recently he is actually also noted difficulty using his right arm to even brush his teeth noting it does occasionally feel weak.   Review of Systems     Objective:   Physical Exam Alert and in no distress.  No palpable tenderness to the neck.  Negative Spurling test.  Normal motor, sensory and DTRs of his arms.       Assessment & Plan:  Arthritis  Neck pain  Other cervical disc degeneration, unspecified cervical region - Plan: MR Cervical Spine Wo Contrast He has been vigilant in doing his conservative care without much success.  I think an MRI is appropriate.  Discussed possible options after the MRI.

## 2019-11-02 ENCOUNTER — Other Ambulatory Visit: Payer: Self-pay

## 2019-11-02 ENCOUNTER — Ambulatory Visit
Admission: RE | Admit: 2019-11-02 | Discharge: 2019-11-02 | Disposition: A | Discharge status: Home / Self Care | Payer: Federal, State, Local not specified - PPO | Source: Ambulatory Visit | Attending: Family Medicine | Admitting: Family Medicine

## 2019-11-02 DIAGNOSIS — I872 Venous insufficiency (chronic) (peripheral): Secondary | ICD-10-CM | POA: Diagnosis not present

## 2019-11-02 DIAGNOSIS — M5030 Other cervical disc degeneration, unspecified cervical region: Secondary | ICD-10-CM

## 2019-11-02 DIAGNOSIS — M4802 Spinal stenosis, cervical region: Secondary | ICD-10-CM | POA: Diagnosis not present

## 2019-11-02 DIAGNOSIS — L309 Dermatitis, unspecified: Secondary | ICD-10-CM | POA: Diagnosis not present

## 2019-11-02 DIAGNOSIS — E041 Nontoxic single thyroid nodule: Secondary | ICD-10-CM

## 2019-11-03 ENCOUNTER — Other Ambulatory Visit: Payer: Self-pay | Admitting: Family Medicine

## 2019-11-03 DIAGNOSIS — E041 Nontoxic single thyroid nodule: Secondary | ICD-10-CM

## 2019-11-18 ENCOUNTER — Other Ambulatory Visit: Payer: Self-pay | Admitting: Family Medicine

## 2019-11-18 DIAGNOSIS — E041 Nontoxic single thyroid nodule: Secondary | ICD-10-CM

## 2019-11-24 ENCOUNTER — Telehealth: Payer: Self-pay

## 2019-11-24 NOTE — Telephone Encounter (Signed)
I did the PA for the pt. Testosterone inj. And was approved and is valid through 10/25/19-11/23/20.

## 2019-11-26 ENCOUNTER — Other Ambulatory Visit: Payer: Federal, State, Local not specified - PPO

## 2019-11-27 ENCOUNTER — Other Ambulatory Visit: Payer: Self-pay | Admitting: Family Medicine

## 2019-11-27 DIAGNOSIS — I10 Essential (primary) hypertension: Secondary | ICD-10-CM

## 2019-12-07 ENCOUNTER — Ambulatory Visit
Admission: RE | Admit: 2019-12-07 | Discharge: 2019-12-07 | Disposition: A | Discharge status: Home / Self Care | Payer: Federal, State, Local not specified - PPO | Source: Ambulatory Visit | Attending: Family Medicine | Admitting: Family Medicine

## 2019-12-07 ENCOUNTER — Other Ambulatory Visit: Payer: Self-pay

## 2019-12-07 ENCOUNTER — Other Ambulatory Visit: Payer: Self-pay | Admitting: Family Medicine

## 2019-12-07 DIAGNOSIS — E041 Nontoxic single thyroid nodule: Secondary | ICD-10-CM

## 2019-12-07 DIAGNOSIS — E042 Nontoxic multinodular goiter: Secondary | ICD-10-CM | POA: Diagnosis not present

## 2019-12-08 ENCOUNTER — Other Ambulatory Visit: Payer: Self-pay | Admitting: Family Medicine

## 2019-12-08 DIAGNOSIS — E291 Testicular hypofunction: Secondary | ICD-10-CM

## 2019-12-08 NOTE — Telephone Encounter (Signed)
[facility] is requesting to fill pt testosterone please advise [facility]

## 2019-12-10 ENCOUNTER — Telehealth: Payer: Self-pay | Admitting: Family Medicine

## 2019-12-10 NOTE — Telephone Encounter (Signed)
Pt called and was scheduled next year for a biopsy and was told that he needed to follow up with his pcp to see why the biopsy was needed.

## 2019-12-10 NOTE — Telephone Encounter (Signed)
The ultrasound shows a suspicious looking nodule and we need to get a biopsy of it to make sure it is not cancer

## 2019-12-14 ENCOUNTER — Telehealth: Payer: Self-pay

## 2019-12-14 DIAGNOSIS — Z125 Encounter for screening for malignant neoplasm of prostate: Secondary | ICD-10-CM

## 2019-12-14 DIAGNOSIS — E291 Testicular hypofunction: Secondary | ICD-10-CM

## 2019-12-14 NOTE — Addendum Note (Signed)
Addended by: Denita Lung on: 12/14/2019 03:50 PM   Modules accepted: Orders

## 2019-12-14 NOTE — Telephone Encounter (Signed)
Pleas put in future orders for pt testosterone lab . Thanks [employer]

## 2019-12-14 NOTE — Telephone Encounter (Signed)
Spoke to pt about biopsy . Pt advised that the scheduler has called him and will call him back to schedule him in [place]. [place]

## 2019-12-16 ENCOUNTER — Other Ambulatory Visit: Payer: Federal, State, Local not specified - PPO

## 2019-12-16 ENCOUNTER — Other Ambulatory Visit: Payer: Self-pay

## 2019-12-16 DIAGNOSIS — Z125 Encounter for screening for malignant neoplasm of prostate: Secondary | ICD-10-CM | POA: Diagnosis not present

## 2019-12-16 DIAGNOSIS — E291 Testicular hypofunction: Secondary | ICD-10-CM

## 2019-12-17 LAB — TESTOSTERONE: Testosterone: 97 ng/dL — ABNORMAL LOW (ref 264–916)

## 2019-12-17 LAB — PSA: Prostate Specific Ag, Serum: 0.3 ng/mL (ref 0.0–4.0)

## 2019-12-17 NOTE — Progress Notes (Signed)
His testosterone was drawn on Thursday after he gave the shot on the prior Sunday.  He usually gets shots every Sunday.  Since this is quite low, I will have him increase his dosing to 1.5 mL and we will recheck his testosterone midcycle which should be on Wednesday.

## 2019-12-28 ENCOUNTER — Telehealth: Payer: Self-pay

## 2019-12-28 DIAGNOSIS — E291 Testicular hypofunction: Secondary | ICD-10-CM

## 2019-12-28 NOTE — Telephone Encounter (Signed)
Pt. Called to scheduled his lab work to check his testosterone level I have him scheduled on 12/30/19 at 10:15a.m.

## 2019-12-29 ENCOUNTER — Other Ambulatory Visit: Payer: Self-pay | Admitting: Family Medicine

## 2019-12-29 DIAGNOSIS — E785 Hyperlipidemia, unspecified: Secondary | ICD-10-CM

## 2019-12-30 ENCOUNTER — Ambulatory Visit
Admission: RE | Admit: 2019-12-30 | Discharge: 2019-12-30 | Disposition: A | Discharge status: Home / Self Care | Payer: Federal, State, Local not specified - PPO | Source: Ambulatory Visit | Attending: Family Medicine | Admitting: Family Medicine

## 2019-12-30 ENCOUNTER — Other Ambulatory Visit (HOSPITAL_COMMUNITY)
Admission: RE | Admit: 2019-12-30 | Discharge: 2019-12-30 | Disposition: A | Discharge status: Home / Self Care | Payer: Federal, State, Local not specified - PPO | Source: Ambulatory Visit | Attending: Physician Assistant | Admitting: Physician Assistant

## 2019-12-30 ENCOUNTER — Other Ambulatory Visit: Payer: Self-pay

## 2019-12-30 ENCOUNTER — Other Ambulatory Visit: Payer: Federal, State, Local not specified - PPO

## 2019-12-30 DIAGNOSIS — D34 Benign neoplasm of thyroid gland: Secondary | ICD-10-CM | POA: Insufficient documentation

## 2019-12-30 DIAGNOSIS — E041 Nontoxic single thyroid nodule: Secondary | ICD-10-CM

## 2019-12-30 DIAGNOSIS — E291 Testicular hypofunction: Secondary | ICD-10-CM | POA: Diagnosis not present

## 2019-12-30 NOTE — Procedures (Signed)
PROCEDURE SUMMARY:  Using direct ultrasound guidance, 5 passes were made using 25 g needles into the nodule within the left lobe of the thyroid.   Ultrasound was used to confirm needle placements on all occasions.   EBL = trace  Specimens were sent to Pathology for analysis.  See procedure note under Imaging tab in Epic for full procedure details.  Keryl Gholson S Cambrey Lupi PA-C 12/30/2019 9:05 AM

## 2019-12-31 LAB — TESTOSTERONE: Testosterone: >1500 ng/dL — ABNORMAL HIGH (ref 264–916)

## 2019-12-31 LAB — CYTOLOGY - NON PAP

## 2019-12-31 NOTE — Progress Notes (Signed)
His most recent testosterone was midcycle and was quite high.  He did give 2 cc of the testosterone.  I will have him back off to 1-1/2 and keep him on a weekly basis.  Looks like at the end of the 1 week, his testosterone is quite low however

## 2020-01-04 ENCOUNTER — Telehealth: Payer: Self-pay

## 2020-01-04 NOTE — Telephone Encounter (Signed)
No need to at this point

## 2020-01-04 NOTE — Telephone Encounter (Signed)
Pt was advised Jay Delacruz 

## 2020-01-04 NOTE — Telephone Encounter (Signed)
Pt wanted to know if he should be taking medicine for his enlarged thyroid. Please advise. [place]

## 2020-02-08 ENCOUNTER — Other Ambulatory Visit: Payer: Self-pay | Admitting: Family Medicine

## 2020-02-08 DIAGNOSIS — M1000 Idiopathic gout, unspecified site: Secondary | ICD-10-CM

## 2020-02-09 ENCOUNTER — Other Ambulatory Visit: Payer: Self-pay | Admitting: Family Medicine

## 2020-02-09 DIAGNOSIS — M1000 Idiopathic gout, unspecified site: Secondary | ICD-10-CM

## 2020-02-09 DIAGNOSIS — M1990 Unspecified osteoarthritis, unspecified site: Secondary | ICD-10-CM

## 2020-02-09 DIAGNOSIS — E781 Pure hyperglyceridemia: Secondary | ICD-10-CM

## 2020-02-09 DIAGNOSIS — E785 Hyperlipidemia, unspecified: Secondary | ICD-10-CM

## 2020-02-09 NOTE — Telephone Encounter (Signed)
[facility] IS REQUESTING TO FILL PT IBUPROFEN 800 MG AND COLCHICINE. PLEASE ADVISE . [place]

## 2020-02-16 ENCOUNTER — Other Ambulatory Visit: Payer: Self-pay | Admitting: Family Medicine

## 2020-02-16 DIAGNOSIS — I10 Essential (primary) hypertension: Secondary | ICD-10-CM

## 2020-02-24 ENCOUNTER — Other Ambulatory Visit: Payer: Self-pay | Admitting: Family Medicine

## 2020-02-24 DIAGNOSIS — M1000 Idiopathic gout, unspecified site: Secondary | ICD-10-CM

## 2020-03-06 ENCOUNTER — Other Ambulatory Visit: Payer: Self-pay | Admitting: Family Medicine

## 2020-03-06 DIAGNOSIS — I10 Essential (primary) hypertension: Secondary | ICD-10-CM

## 2020-03-10 DIAGNOSIS — Z20828 Contact with and (suspected) exposure to other viral communicable diseases: Secondary | ICD-10-CM | POA: Diagnosis not present

## 2020-03-10 DIAGNOSIS — Z20822 Contact with and (suspected) exposure to covid-19: Secondary | ICD-10-CM | POA: Diagnosis not present

## 2020-03-26 ENCOUNTER — Other Ambulatory Visit: Payer: Self-pay | Admitting: Family Medicine

## 2020-03-26 DIAGNOSIS — E785 Hyperlipidemia, unspecified: Secondary | ICD-10-CM

## 2020-05-11 ENCOUNTER — Telehealth: Payer: Self-pay

## 2020-05-11 ENCOUNTER — Other Ambulatory Visit: Payer: Self-pay

## 2020-05-11 MED ORDER — "SYRINGE/NEEDLE (DISP) 18G X 1"" 3 ML MISC": 1 refills | Status: DC

## 2020-05-11 NOTE — Telephone Encounter (Signed)
Done [facility] 

## 2020-05-11 NOTE — Telephone Encounter (Signed)
I received a fax from [facility] for a refill on the pts. Syr/ndl 79ml 18G needles pt. Last apt was 10/13/19.

## 2020-05-16 ENCOUNTER — Ambulatory Visit: Payer: Federal, State, Local not specified - PPO | Admitting: Family Medicine

## 2020-05-16 ENCOUNTER — Other Ambulatory Visit: Payer: Self-pay

## 2020-05-16 ENCOUNTER — Ambulatory Visit (HOSPITAL_COMMUNITY)
Admission: RE | Admit: 2020-05-16 | Discharge: 2020-05-16 | Disposition: A | Discharge status: Home / Self Care | Payer: Federal, State, Local not specified - PPO | Source: Ambulatory Visit | Attending: Family Medicine | Admitting: Family Medicine

## 2020-05-16 VITALS — BP 144/90 | HR 79 | Temp 97.8°F | Wt 299.4 lb

## 2020-05-16 DIAGNOSIS — R600 Localized edema: Secondary | ICD-10-CM

## 2020-05-16 NOTE — Progress Notes (Signed)
Lower extremity venous has been completed.   Preliminary results in CV Proc.   Abram Sander 05/16/2020 1:19 PM

## 2020-05-16 NOTE — Progress Notes (Signed)
   Subjective:    Patient ID: Jay Delacruz, male    DOB: July 10, 1956, 64 y.o.   MRN: [id number]  HPI He is here for evaluation of bilateral leg pain, right greater than left.  This started after he came back from a 9-hour trip in each direction.  He then went on another trip of 6 hours and did have continued difficulty with mainly calf discomfort.  No chest pain no shortness of breath.   Review of Systems     Objective:   Physical Exam Alert and in no distress.  Cardiac exam shows regular rhythm without murmurs gallops.  Lungs are clear to auscultation.  Right lower extremity does show 1+ pitting edema with negative Homans' sign.  Left leg shows no edema and negative Homans' sign.      Assessment & Plan:  Bilateral leg edema - Plan: VAS [place] LOWER EXTREMITY VENOUS (DVT) With 2 trips sitting in the car for an extended period of time, he is a set up for DVT

## 2020-06-03 ENCOUNTER — Other Ambulatory Visit: Payer: Self-pay | Admitting: Family Medicine

## 2020-06-03 DIAGNOSIS — I10 Essential (primary) hypertension: Secondary | ICD-10-CM

## 2020-06-08 ENCOUNTER — Telehealth: Payer: Self-pay

## 2020-06-08 DIAGNOSIS — E291 Testicular hypofunction: Secondary | ICD-10-CM

## 2020-06-08 NOTE — Telephone Encounter (Signed)
Pt. Called LM stating he needs a refill on his testosterone pt. Last apt was 05/16/20.

## 2020-06-09 MED ORDER — TESTOSTERONE CYPIONATE 200 MG/ML IM SOLN: INTRAMUSCULAR | 0 refills | Status: DC

## 2020-06-09 NOTE — Telephone Encounter (Signed)
You should have a message about repeating the testosterone.

## 2020-06-09 NOTE — Telephone Encounter (Signed)
Pt has appt 06-14-20 and enough med to last until this Sunday. [place]

## 2020-06-09 NOTE — Addendum Note (Signed)
Addended by: Denita Lung on: 06/09/2020 03:17 PM   Modules accepted: Orders

## 2020-06-09 NOTE — Telephone Encounter (Signed)
done

## 2020-06-10 ENCOUNTER — Telehealth: Payer: Self-pay

## 2020-06-10 NOTE — Telephone Encounter (Signed)
200 ml or 1 ML weekly .  Q Sunday he gives him self a shot . [place]

## 2020-06-10 NOTE — Telephone Encounter (Signed)
Received fax from [facility] for a refill on his Testosterone they received the refill just need the instructions for the prescription on the refill.

## 2020-06-10 NOTE — Telephone Encounter (Signed)
I don't know his dosing. Please take care of this

## 2020-06-14 ENCOUNTER — Encounter: Payer: Self-pay | Admitting: Family Medicine

## 2020-06-14 ENCOUNTER — Other Ambulatory Visit: Payer: Self-pay

## 2020-06-14 ENCOUNTER — Ambulatory Visit: Payer: Federal, State, Local not specified - PPO | Admitting: Family Medicine

## 2020-06-14 VITALS — BP 132/84 | HR 72 | Temp 98.0°F | Wt 292.8 lb

## 2020-06-14 DIAGNOSIS — E291 Testicular hypofunction: Secondary | ICD-10-CM | POA: Diagnosis not present

## 2020-06-14 MED ORDER — TESTOSTERONE CYPIONATE 200 MG/ML IM SOLN: 200.0000 mg | INTRAMUSCULAR | 3 refills | Status: DC

## 2020-06-14 NOTE — Progress Notes (Signed)
   Subjective:    Patient ID: Jay Delacruz, male    DOB: [birth year], 64 y.o.   MRN: [id number]  HPI He is here for a recheck.  He states that he takes 1 cc of testosterone weekly starting every Sunday.  His last blood draw showed a quite elevated number.  He states that by the end of the week he can tell that his numbers are coming down.  Review of Systems     Objective:   Physical Exam Alert and in no distress otherwise not examined       Assessment & Plan:  Hypogonadism male - Plan: Testosterone, testosterone cypionate (DEPOTESTOSTERONE CYPIONATE) 200 MG/ML injection I will draw him midcycle and see what his numbers are and adjust accordingly.  He is also to set up for complete exam in December so we can catch him at the 33-month interval.

## 2020-06-22 ENCOUNTER — Other Ambulatory Visit: Payer: Self-pay

## 2020-06-22 ENCOUNTER — Other Ambulatory Visit: Payer: Federal, State, Local not specified - PPO

## 2020-06-22 DIAGNOSIS — E291 Testicular hypofunction: Secondary | ICD-10-CM

## 2020-06-23 ENCOUNTER — Telehealth: Payer: Self-pay | Admitting: Family Medicine

## 2020-06-23 LAB — TESTOSTERONE: Testosterone: 99 ng/dL — ABNORMAL LOW (ref 264–916)

## 2020-06-23 NOTE — Telephone Encounter (Signed)
Pt called for lab results, said can reach at T# [phone] he is up now.  Thanks

## 2020-06-23 NOTE — Telephone Encounter (Signed)
Pt called back for lab results . Please advise [facility]

## 2020-07-18 ENCOUNTER — Other Ambulatory Visit: Payer: Self-pay

## 2020-07-18 ENCOUNTER — Ambulatory Visit: Payer: Federal, State, Local not specified - PPO | Admitting: Family Medicine

## 2020-07-18 ENCOUNTER — Encounter: Payer: Self-pay | Admitting: Family Medicine

## 2020-07-18 VITALS — BP 132/80 | HR 90 | Temp 97.2°F | Wt 293.2 lb

## 2020-07-18 DIAGNOSIS — L989 Disorder of the skin and subcutaneous tissue, unspecified: Secondary | ICD-10-CM

## 2020-07-18 NOTE — Progress Notes (Signed)
   Subjective:    Patient ID: Jay Delacruz, male    DOB: Oct 09, 1956, 64 y.o.   MRN: [id number]  HPI He is here for consult concerning a lump in the right upper chest area.  He has no history of injury to it and it is not tender to palpation.   Review of Systems     Objective:   Physical Exam Alert and in no distress.  Exam of the right upper chest does show a soft fluctuant lesion in the area of the Barclay joint.  It is nontender to palpation.  Supraclavicular area shows no lesions.  Neck is supple without adenopathy or thyromegaly.       Assessment & Plan:  Benign skin lesion of sternum I explained that I thought this was a benign lesion and showed no characteristics of any area of concern.  He was comfortable with that.

## 2020-09-02 ENCOUNTER — Ambulatory Visit: Payer: Federal, State, Local not specified - PPO | Admitting: Family Medicine

## 2020-09-02 VITALS — BP 136/86 | HR 67 | Temp 97.1°F | Wt 291.6 lb

## 2020-09-02 DIAGNOSIS — J381 Polyp of vocal cord and larynx: Secondary | ICD-10-CM

## 2020-09-02 DIAGNOSIS — Z23 Encounter for immunization: Secondary | ICD-10-CM | POA: Diagnosis not present

## 2020-09-02 DIAGNOSIS — M1990 Unspecified osteoarthritis, unspecified site: Secondary | ICD-10-CM

## 2020-09-02 MED ORDER — IBUPROFEN 800 MG PO TABS: ORAL_TABLET | ORAL | 1 refills | Status: DC

## 2020-09-02 NOTE — Progress Notes (Signed)
   Subjective:    Patient ID: Jay Delacruz, male    DOB: 17-Feb-1956, 64 y.o.   MRN: [id number]  HPI He has a 2-week history of difficulty with hoarse voice.  His not been using his voice more than normal.  He does have a previous history of difficulty with vocal cord polyps and feels as if it is the same symptoms coming back again. He would also like a refill on his ibuprofen. Review of Systems     Objective:   Physical Exam Alert and in no distress.  Exam of the mouth shows no lesions.  Neck is supple without adenopathy or thyromegaly.      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 Lucia Gaskins in the past for this and I will therefore refer him back.

## 2020-09-03 ENCOUNTER — Other Ambulatory Visit: Payer: Self-pay | Admitting: Family Medicine

## 2020-09-03 DIAGNOSIS — I10 Essential (primary) hypertension: Secondary | ICD-10-CM

## 2020-09-19 ENCOUNTER — Ambulatory Visit (INDEPENDENT_AMBULATORY_CARE_PROVIDER_SITE_OTHER): Payer: Federal, State, Local not specified - PPO | Admitting: Otolaryngology

## 2020-09-19 ENCOUNTER — Other Ambulatory Visit: Payer: Self-pay

## 2020-09-19 ENCOUNTER — Encounter (INDEPENDENT_AMBULATORY_CARE_PROVIDER_SITE_OTHER): Payer: Self-pay | Admitting: Otolaryngology

## 2020-09-19 VITALS — Temp 97.2°F

## 2020-09-19 DIAGNOSIS — K219 Gastro-esophageal reflux disease without esophagitis: Secondary | ICD-10-CM | POA: Diagnosis not present

## 2020-09-19 DIAGNOSIS — R490 Dysphonia: Secondary | ICD-10-CM

## 2020-09-19 NOTE — Progress Notes (Signed)
Dr. HPI: Jay Delacruz is a 64 y.o. male who presents is referred by Dr. Redmond School for evaluation of voice changes.  He has had a previous history of a vocal cord polyp removed in 2014.  He stated that his voice started cracking especially when he tried to reach a high pitch over the past month or 2.  He does have history of reflux problems. In the office today he has minimal hoarseness..  His voice is doing better today.  Past Medical History:  Diagnosis Date  . Allergy   . Atopic dermatitis   . Complication of anesthesia    loose tooth bottom front  . ED (erectile dysfunction)   . GERD (gastroesophageal reflux disease)    past hx   . Gout   . Hemorrhoids   . Hyperlipidemia   . Hypertension   . Obesity   . Wears glasses    Past Surgical History:  Procedure Laterality Date  . ARTHROSCOPIC REPAIR ACL  1995   left knee  . COLONOSCOPY  2015   [facility]  . DORSAL COMPARTMENT RELEASE  2002   lt wrist  . LARYNGOSCOPY  1990   vocal cord polyp  . MICROLARYNGOSCOPY WITH CO2 LASER AND EXCISION OF VOCAL CORD LESION N/A 07/03/2013   Procedure: MICROLARYNGOSCOPY WITH EXCISION OF VOCAL CORD LESION;  Surgeon: Rozetta Nunnery, MD;  Location: [facility];  Service: ENT;  Laterality: N/A;  . PATELLA RECONSTRUCTION  96,01   right and left knees  . TONSILLECTOMY     Social History   Socioeconomic History  . Marital status: Married    Spouse name: Not on file  . Number of children: Not on file  . Years of education: Not on file  . Highest education level: Not on file  Occupational History  . Not on file  Tobacco Use  . Smoking status: Former Smoker    Packs/day: 1.50    Types: Cigarettes    Quit date: 07/03/2015    Years since quitting: 5.2  . Smokeless tobacco: Never Used  Substance and Sexual Activity  . Alcohol use: Yes    Alcohol/week: 12.0 standard drinks    Types: 12 Cans of beer per week    Comment: occ  . Drug use: Yes    Frequency: 2.0 times per week     Types: Marijuana  . Sexual activity: Yes  Other Topics Concern  . Not on file  Social History Narrative  . Not on file   Social Determinants of Health   Financial Resource Strain:   . Difficulty of Paying Living Expenses: Not on file  Food Insecurity:   . Worried About Charity fundraiser in the Last Year: Not on file  . Ran Out of Food in the Last Year: Not on file  Transportation Needs:   . Lack of Transportation (Medical): Not on file  . Lack of Transportation (Non-Medical): Not on file  Physical Activity:   . Days of Exercise per Week: Not on file  . Minutes of Exercise per Session: Not on file  Stress:   . Feeling of Stress : Not on file  Social Connections:   . Frequency of Communication with Friends and Family: Not on file  . Frequency of Social Gatherings with Friends and Family: Not on file  . Attends Religious Services: Not on file  . Active Member of Clubs or Organizations: Not on file  . Attends Archivist Meetings: Not on file  .  Marital Status: Not on file   Family History  Problem Relation Age of Onset  . Cancer Mother 82       Pancreatic cancer  . Cancer Father 66       Colon cancer- dx'd age 80, died age 77   . Colon cancer Father   . Colon polyps Neg Hx   . Esophageal cancer Neg Hx   . Rectal cancer Neg Hx   . Stomach cancer Neg Hx    Allergies  Allergen Reactions  . Lisinopril Swelling    SWELLING TO FACE AND TONGUE   Prior to Admission medications   Medication Sig Start Date End Date Taking? Authorizing Provider  allopurinol (ZYLOPRIM) 300 MG tablet TAKE 1 TABLET(300 MG) BY MOUTH DAILY 02/24/20  Yes Denita Lung, MD  amLODipine (NORVASC) 5 MG tablet TAKE 1 TABLET(5 MG) BY MOUTH DAILY 02/17/20  Yes Denita Lung, MD  atorvastatin (LIPITOR) 40 MG tablet TAKE 1 TABLET(40 MG) BY MOUTH DAILY 03/30/20  Yes Denita Lung, MD  B-D 3CC LUER-LOK SYR 18GX1-1/2 18G X 1-1/2" 3 ML MISC USE TO INJECT TESTOSTERONE 03/30/19  Yes Denita Lung, MD   colchicine 0.6 MG tablet TAKE 1 TABLET(0.6 MG) BY MOUTH TWICE DAILY AS NEEDED 02/10/20  Yes Denita Lung, MD  fenofibrate (TRICOR) 145 MG tablet Take 1 tablet (145 mg total) by mouth daily. 05/26/19  Yes Denita Lung, MD  fluocinonide cream (LIDEX) 0.05 % APPLY SPARINGLY TO THE AFFECTED AREA TWICE DAILY 11/12/17  Yes Denita Lung, MD  halobetasol (ULTRAVATE) 0.05 % ointment APP AA BID. ALTERNATE WITH ELIDEL 10/02/18  Yes [provider]  ibuprofen (ADVIL) 800 MG tablet TAKE 1 TABLET(800 MG) BY MOUTH EVERY 8 HOURS AS NEEDED 09/02/20  Yes Denita Lung, MD  losartan-hydrochlorothiazide [facility]) 100-12.5 MG tablet TAKE 1 TABLET BY MOUTH DAILY 09/05/20  Yes Denita Lung, MD  mupirocin ointment (BACTROBAN) 2 % Place 1 application into the nose 2 (two) times daily. 07/07/18  Yes Henson, Vickie L, NP-C  omeprazole (PRILOSEC OTC) 20 MG tablet Take 20 mg by mouth daily.    Yes [provider]  pimecrolimus (ELIDEL) 1 % cream APP AA BID 10/02/18  Yes [provider]  Syringe/Needle, Disp, 18G X 1" 3 ML MISC Draw up testosterone with 18g and inject with another 18g needle. 05/11/20  Yes Denita Lung, MD  tadalafil (CIALIS) 20 MG tablet Take 1 tablet (20 mg total) by mouth daily as needed for erectile dysfunction. 09/15/15  Yes Denita Lung, MD  testosterone cypionate (DEPOTESTOSTERONE CYPIONATE) 200 MG/ML injection Inject 1 mL (200 mg total) into the muscle once a week. Take as directed 06/14/20  Yes Denita Lung, MD  trimethoprim-polymyxin b (POLYTRIM) ophthalmic solution Place 1 drop into both eyes every 6 (six) hours. 07/07/18  Yes Henson, Vickie L, NP-C  VASCEPA 1 g capsule TAKE 2 CAPSULES(2 GRAMS) BY MOUTH TWICE DAILY 02/09/20  Yes Denita Lung, MD     Positive ROS: Otherwise negative  All other systems have been reviewed and were otherwise negative with the exception of those mentioned in the HPI and as above.  Physical Exam: Constitutional: Alert,  well-appearing, no acute distress.  No significant hoarseness noted on speech while talking to the patient in the office today. Ears: External ears without lesions or tenderness. Ear canals are clear bilaterally with intact, clear TMs.  Nasal: External nose without lesions. Septum with mild deviation and moderate rhinitis.. Clear nasal  passages otherwise. Oral: Lips and gums without lesions. Tongue and palate mucosa without lesions. Posterior oropharynx clear. Fiberoptic laryngoscopy was performed to the left nostril after decongesting the nose and applying topical anesthetic.  The nasopharynx was clear.  The base of tongue vallecula and epiglottis were normal.  Vocal cords were clear bilaterally with no polyps or lesions noted on the vocal cords.  He had normal vocal cord mobility bilaterally.  He had mild mucosal edema of the arytenoids but no mucosal abnormalities and minimal erythema.  Piriform sinuses were clear. Neck: No palpable adenopathy or masses Respiratory: Breathing comfortably  Skin: No facial/neck lesions or rash noted.  Procedures  Assessment: History of hoarseness with normal vocal cord and laryngeal examination on fiberoptic laryngoscopy. I suspect his hoarseness could be related to laryngeal pharyngeal reflux.  Plan: Suggested taking omeprazole or Prilosec which he has taken in the past daily before dinner for 3 to 4 weeks to see if this helps at all with his hoarseness or voice cracking. Recommended follow-up here if he has any persistent hoarseness. Reassured him of normal vocal cord examination with no evidence of recurrent polyps.   Radene Journey, MD   CC:

## 2020-10-17 DIAGNOSIS — M25562 Pain in left knee: Secondary | ICD-10-CM | POA: Diagnosis not present

## 2020-10-19 ENCOUNTER — Telehealth: Payer: Self-pay | Admitting: Family Medicine

## 2020-10-19 NOTE — Telephone Encounter (Signed)
Pt called and is requesting to come in for the moderna booster last shot was April the 3rd pt can be reached at [phone] Informed pt that you was out of the office

## 2020-10-19 NOTE — Telephone Encounter (Signed)
ok 

## 2020-10-20 NOTE — Telephone Encounter (Signed)
LVM for pt to call and schedule [facility]

## 2020-10-24 ENCOUNTER — Other Ambulatory Visit: Payer: Self-pay

## 2020-10-24 ENCOUNTER — Ambulatory Visit (INDEPENDENT_AMBULATORY_CARE_PROVIDER_SITE_OTHER): Payer: Federal, State, Local not specified - PPO

## 2020-10-24 DIAGNOSIS — Z23 Encounter for immunization: Secondary | ICD-10-CM

## 2020-11-25 ENCOUNTER — Telehealth: Payer: Self-pay

## 2020-11-25 NOTE — Telephone Encounter (Signed)
P.A. TESTOSTERONE CYPIONATE

## 2020-11-26 NOTE — Telephone Encounter (Signed)
P.A. approved til 11/21/21, pt informed

## 2020-12-02 ENCOUNTER — Other Ambulatory Visit: Payer: Self-pay | Admitting: Family Medicine

## 2020-12-02 DIAGNOSIS — I10 Essential (primary) hypertension: Secondary | ICD-10-CM

## 2020-12-14 ENCOUNTER — Ambulatory Visit: Payer: Federal, State, Local not specified - PPO | Admitting: Family Medicine

## 2020-12-14 ENCOUNTER — Encounter: Payer: Self-pay | Admitting: Family Medicine

## 2020-12-14 ENCOUNTER — Other Ambulatory Visit: Payer: Self-pay

## 2020-12-14 VITALS — BP 134/80 | HR 72 | Temp 97.2°F | Ht 72.0 in | Wt 297.6 lb

## 2020-12-14 DIAGNOSIS — K219 Gastro-esophageal reflux disease without esophagitis: Secondary | ICD-10-CM

## 2020-12-14 DIAGNOSIS — E785 Hyperlipidemia, unspecified: Secondary | ICD-10-CM

## 2020-12-14 DIAGNOSIS — Z125 Encounter for screening for malignant neoplasm of prostate: Secondary | ICD-10-CM

## 2020-12-14 DIAGNOSIS — Z0000 Encounter for general adult medical examination without abnormal findings: Secondary | ICD-10-CM

## 2020-12-14 DIAGNOSIS — M1000 Idiopathic gout, unspecified site: Secondary | ICD-10-CM | POA: Diagnosis not present

## 2020-12-14 DIAGNOSIS — E291 Testicular hypofunction: Secondary | ICD-10-CM

## 2020-12-14 DIAGNOSIS — Z87891 Personal history of nicotine dependence: Secondary | ICD-10-CM

## 2020-12-14 DIAGNOSIS — I10 Essential (primary) hypertension: Secondary | ICD-10-CM

## 2020-12-14 DIAGNOSIS — M1990 Unspecified osteoarthritis, unspecified site: Secondary | ICD-10-CM

## 2020-12-14 DIAGNOSIS — E781 Pure hyperglyceridemia: Secondary | ICD-10-CM

## 2020-12-14 MED ORDER — ATORVASTATIN CALCIUM 40 MG PO TABS: ORAL_TABLET | ORAL | 3 refills | Status: DC

## 2020-12-14 MED ORDER — TESTOSTERONE CYPIONATE 200 MG/ML IM SOLN: 200.0000 mg | INTRAMUSCULAR | 3 refills | Status: DC

## 2020-12-14 MED ORDER — LOSARTAN POTASSIUM-HCTZ 100-12.5 MG PO TABS: 1.0000 | ORAL_TABLET | Freq: Every day | ORAL | 0 refills | Status: DC

## 2020-12-14 MED ORDER — ICOSAPENT ETHYL 1 G PO CAPS: ORAL_CAPSULE | ORAL | 3 refills | Status: DC

## 2020-12-14 MED ORDER — AMLODIPINE BESYLATE 5 MG PO TABS: ORAL_TABLET | ORAL | 3 refills | Status: DC

## 2020-12-14 NOTE — Progress Notes (Signed)
   Subjective:    Patient ID: Jay Delacruz, male    DOB: 12-Jun-1956, 64 y.o.   MRN: [id number]  HPI He is here for complete examination.  He has no particular concerns or complaints.  He does have a history of polyps and is scheduled for routine follow-up on that.  He continues on his testosterone and is now midcycle so that should be accurate.  Continues on his lipid medications and is having no trouble with that.  His gout is causing no difficulty.  He continues on allopurinol.  He is also taking losartan/HCTZ, amlodipine for his blood pressure.  Continues on Vascepa and Lipitor but is not taking the fenofibrate.  He has a history of reflux disease but presently is not on any medication.  He is a former smoker.   Review of Systems  All other systems reviewed and are negative.      Objective:   Physical Exam Alert and in no distress. Tympanic membranes and canals are normal. Pharyngeal area is normal. Neck is supple without adenopathy or thyromegaly. Cardiac exam shows a regular sinus rhythm without murmurs or gallops. Lungs are clear to auscultation. Abdominal exam shows no masses or tenderness.       Assessment & Plan:  Routine general medical examination at a health care facility - Plan: CBC with Differential/Platelet, Comprehensive metabolic panel, Lipid panel  Arthritis  Hypogonadism male - Plan: Testosterone, testosterone cypionate (DEPOTESTOSTERONE CYPIONATE) 200 MG/ML injection  Screening for prostate cancer - Plan: PSA  Former smoker  Hyperlipidemia with target LDL less than 100 - Plan: Lipid panel, icosapent Ethyl (VASCEPA) 1 g capsule, atorvastatin (LIPITOR) 40 MG tablet  Morbid obesity (HCC)  Idiopathic gout, unspecified chronicity, unspecified site - Plan: Uric Acid  Primary hypertension  Gastroesophageal reflux disease, unspecified whether esophagitis present  Hypertriglyceridemia - Plan: icosapent Ethyl (VASCEPA) 1 g capsule  Essential hypertension - Plan:  losartan-hydrochlorothiazide (HYZAAR) 100-12.5 MG tablet, amLODipine (NORVASC) 5 MG tablet  He will continue on his present medication regimen.  Discussed the need for him to make diet and exercise changes however he is really been unable to do this since being in my practice.  He will follow up with GI based on previous protocol.

## 2020-12-15 LAB — CBC WITH DIFFERENTIAL/PLATELET
Basophils Absolute: 0 10*3/uL (ref 0.0–0.2)
Basos: 1 %
EOS (ABSOLUTE): 0.2 10*3/uL (ref 0.0–0.4)
Eos: 4 %
Hematocrit: 39.8 % (ref 37.5–51.0)
Hemoglobin: 13 g/dL (ref 13.0–17.7)
Immature Grans (Abs): 0 10*3/uL (ref 0.0–0.1)
Immature Granulocytes: 0 %
Lymphocytes Absolute: 1.6 10*3/uL (ref 0.7–3.1)
Lymphs: 38 %
MCH: 26.4 pg — ABNORMAL LOW (ref 26.6–33.0)
MCHC: 32.7 g/dL (ref 31.5–35.7)
MCV: 81 fL (ref 79–97)
Monocytes Absolute: 0.3 10*3/uL (ref 0.1–0.9)
Monocytes: 8 %
Neutrophils Absolute: 2.2 10*3/uL (ref 1.4–7.0)
Neutrophils: 49 %
Platelets: 206 10*3/uL (ref 150–450)
RBC: 4.93 x10E6/uL (ref 4.14–5.80)
RDW: 14.6 % (ref 11.6–15.4)
WBC: 4.3 10*3/uL (ref 3.4–10.8)

## 2020-12-15 LAB — TESTOSTERONE: Testosterone: 493 ng/dL (ref 264–916)

## 2020-12-15 LAB — COMPREHENSIVE METABOLIC PANEL
ALT: 21 IU/L (ref 0–44)
AST: 36 IU/L (ref 0–40)
Albumin/Globulin Ratio: 1.4 (ref 1.2–2.2)
Albumin: 4.3 g/dL (ref 3.8–4.8)
Alkaline Phosphatase: 82 IU/L (ref 44–121)
BUN/Creatinine Ratio: 13 (ref 10–24)
BUN: 11 mg/dL (ref 8–27)
Bilirubin Total: 0.5 mg/dL (ref 0.0–1.2)
CO2: 21 mmol/L (ref 20–29)
Calcium: 10.1 mg/dL (ref 8.6–10.2)
Chloride: 100 mmol/L (ref 96–106)
Creatinine, Ser: 0.87 mg/dL (ref 0.76–1.27)
GFR calc Af Amer: 107 mL/min/{1.73_m2} (ref >59)
GFR calc non Af Amer: 92 mL/min/{1.73_m2} (ref >59)
Globulin, Total: 3 g/dL (ref 1.5–4.5)
Glucose: 107 mg/dL — ABNORMAL HIGH (ref 65–99)
Potassium: 5.1 mmol/L (ref 3.5–5.2)
Sodium: 139 mmol/L (ref 134–144)
Total Protein: 7.3 g/dL (ref 6.0–8.5)

## 2020-12-15 LAB — URIC ACID: Uric Acid: 5.4 mg/dL (ref 3.8–8.4)

## 2020-12-15 LAB — LIPID PANEL
Chol/HDL Ratio: 3.6 ratio (ref 0.0–5.0)
Cholesterol, Total: 139 mg/dL (ref 100–199)
HDL: 39 mg/dL — ABNORMAL LOW (ref >39)
LDL Chol Calc (NIH): 44 mg/dL (ref 0–99)
Triglycerides: 380 mg/dL — ABNORMAL HIGH (ref 0–149)
VLDL Cholesterol Cal: 56 mg/dL — ABNORMAL HIGH (ref 5–40)

## 2020-12-15 LAB — PSA: Prostate Specific Ag, Serum: 0.3 ng/mL (ref 0.0–4.0)

## 2020-12-31 DIAGNOSIS — Z1152 Encounter for screening for COVID-19: Secondary | ICD-10-CM | POA: Diagnosis not present

## 2021-03-01 ENCOUNTER — Other Ambulatory Visit: Payer: Self-pay | Admitting: Family Medicine

## 2021-03-01 DIAGNOSIS — I10 Essential (primary) hypertension: Secondary | ICD-10-CM

## 2021-05-16 ENCOUNTER — Telehealth (INDEPENDENT_AMBULATORY_CARE_PROVIDER_SITE_OTHER): Payer: Federal, State, Local not specified - PPO | Admitting: Family Medicine

## 2021-05-16 ENCOUNTER — Ambulatory Visit: Payer: Federal, State, Local not specified - PPO

## 2021-05-16 DIAGNOSIS — U071 COVID-19: Secondary | ICD-10-CM

## 2021-05-16 DIAGNOSIS — J309 Allergic rhinitis, unspecified: Secondary | ICD-10-CM | POA: Diagnosis not present

## 2021-05-16 DIAGNOSIS — R0981 Nasal congestion: Secondary | ICD-10-CM | POA: Diagnosis not present

## 2021-05-16 NOTE — Progress Notes (Signed)
   Subjective:    Patient ID: Jay Delacruz, male    DOB: July 02, 1956, 65 y.o.   MRN: [id number]  HPI Documentation for virtual audio and video telecommunications through [place] encounter: The patient was located at home. 2 patient identifiers used.  The provider was located in the office. The patient did consent to this visit and is aware of possible charges through their insurance for this visit. The other persons participating in this telemedicine service were none. Time spent on call was 5 minutes and in review of previous records >20 minutes total for counseling and coordination of care. This virtual service is not related to other E/M service within previous 7 days. He helped a friend move some furniture over the weekend and found out that that friend was positive for COVID.  He states that on Sunday he did have some nasal and chest congestion as well as some fatigue.  Yesterday the chest congestion and nasal congestion got worse as well as rhinorrhea with myalgias.  He then was tested yesterday and the test was positive.  He has had Moderna x3.  He does have underlying allergies. Review of Systems     Objective:   Physical Exam  Alert and in no distress otherwise not examined.  He does not appear toxic nor tachypneic      Assessment & Plan:  COVID-19  Allergic rhinitis, unspecified seasonality, unspecified trigger Recommend supportive care with Tylenol for aches and pains as well as cough medicine on an as-needed basis.  Discussed with difficulty with fever, shortness of breath and increasing cough and if that occurs he is to call for further advice.  He is to stay sequestered for 5 days and if his symptoms are getting better he should then wear a mask for another 5 days.  He was comfortable with that.

## 2021-05-25 ENCOUNTER — Other Ambulatory Visit: Payer: Self-pay | Admitting: Family Medicine

## 2021-05-29 ENCOUNTER — Other Ambulatory Visit: Payer: Self-pay | Admitting: Family Medicine

## 2021-05-29 DIAGNOSIS — I10 Essential (primary) hypertension: Secondary | ICD-10-CM

## 2021-06-05 ENCOUNTER — Ambulatory Visit (INDEPENDENT_AMBULATORY_CARE_PROVIDER_SITE_OTHER): Payer: Federal, State, Local not specified - PPO | Admitting: Family Medicine

## 2021-06-05 VITALS — BP 148/92 | HR 87 | Temp 97.0°F | Ht 72.0 in | Wt 304.2 lb

## 2021-06-05 DIAGNOSIS — E291 Testicular hypofunction: Secondary | ICD-10-CM | POA: Diagnosis not present

## 2021-06-05 DIAGNOSIS — Z125 Encounter for screening for malignant neoplasm of prostate: Secondary | ICD-10-CM | POA: Diagnosis not present

## 2021-06-05 DIAGNOSIS — Z8616 Personal history of COVID-19: Secondary | ICD-10-CM

## 2021-06-05 NOTE — Progress Notes (Signed)
   Subjective:    Patient ID: Jay Delacruz, male    DOB: 1956-09-01, 65 y.o.   MRN: [id number]  HPI He is here for an interval evaluation.  He gives himself testosterone shots weekly and usually does it on Sunday.  He states that his energy, stamina, strength and libido are all in great shape.  He recently did have COVID on May 24 when he was supposed to come in for his fourth shot.   Review of Systems     Objective:   Physical Exam Alert and in no distress otherwise not examined       Assessment & Plan:  Hypogonadism male  History of COVID-19 He will return Wednesday for a blood draw for for testosterone. I then discussed the fourth vaccination and recommended that he wait about 6 months.  Explained that at that time we might have a new injection available for him.  He was comfortable with that.

## 2021-06-07 ENCOUNTER — Other Ambulatory Visit: Payer: Federal, State, Local not specified - PPO

## 2021-06-07 DIAGNOSIS — Z125 Encounter for screening for malignant neoplasm of prostate: Secondary | ICD-10-CM

## 2021-06-07 DIAGNOSIS — E291 Testicular hypofunction: Secondary | ICD-10-CM | POA: Diagnosis not present

## 2021-06-08 LAB — TESTOSTERONE: Testosterone: 120 ng/dL — ABNORMAL LOW (ref 264–916)

## 2021-06-08 LAB — PSA: Prostate Specific Ag, Serum: 0.3 ng/mL (ref 0.0–4.0)

## 2021-06-12 NOTE — Progress Notes (Signed)
His last testosterone was apparently midcycle but quite low.  Discussed making sure the sites of injection are changed to not give the shot in the same area where previous shot was given to interfere with absorption.

## 2021-06-15 ENCOUNTER — Other Ambulatory Visit: Payer: Federal, State, Local not specified - PPO

## 2021-06-15 ENCOUNTER — Other Ambulatory Visit: Payer: Self-pay

## 2021-06-15 DIAGNOSIS — E291 Testicular hypofunction: Secondary | ICD-10-CM | POA: Diagnosis not present

## 2021-06-16 LAB — TESTOSTERONE: Testosterone: 123 ng/dL — ABNORMAL LOW (ref 264–916)

## 2021-06-16 MED ORDER — TESTOSTERONE CYPIONATE 200 MG/ML IM SOLN: INTRAMUSCULAR | 3 refills | Status: DC

## 2021-06-16 NOTE — Progress Notes (Signed)
We have checked his testosterone twice midcycle and both numbers are quite low.  I will double his dosing up to 2cc/week and recheck him in a couple of weeks.

## 2021-08-15 ENCOUNTER — Other Ambulatory Visit: Payer: Self-pay

## 2021-08-15 ENCOUNTER — Ambulatory Visit (INDEPENDENT_AMBULATORY_CARE_PROVIDER_SITE_OTHER): Payer: Federal, State, Local not specified - PPO

## 2021-08-15 DIAGNOSIS — Z23 Encounter for immunization: Secondary | ICD-10-CM

## 2021-08-17 IMAGING — US US THYROID
1 series · 12 of 25 positions shown · non-contrast
Comparison: MR 11/02/2019

CLINICAL DATA: 4.5 cm left nodule noted on cervical MR

EXAM:
THYROID ULTRASOUND
TECHNIQUE: Ultrasound examination of the thyroid gland and adjacent soft
tissues was performed.

[Series 1: us thyroid · 0.07mm/px · 12 of 46 slices shown]
[im 2/46]
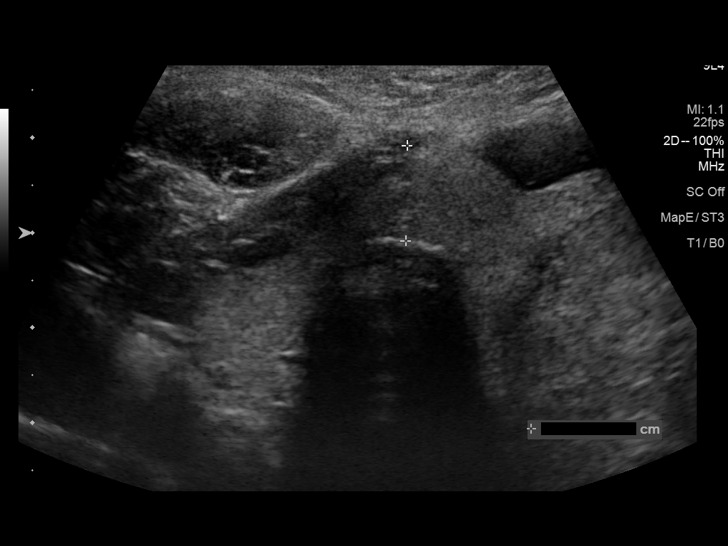
[im 6/46]
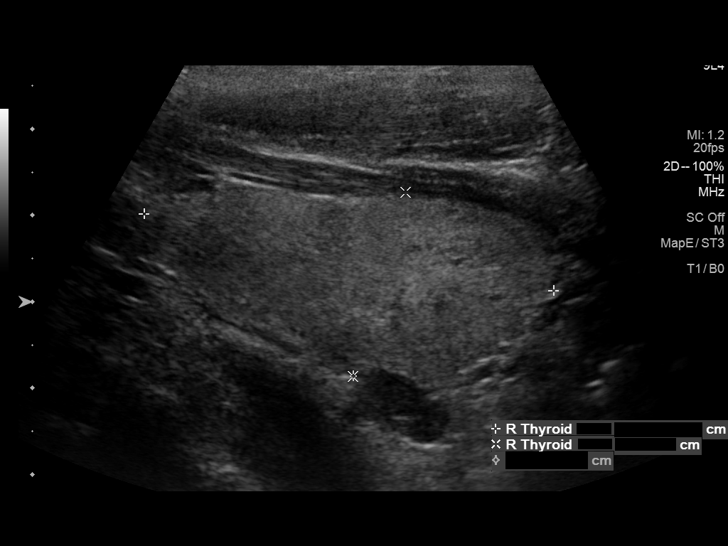
[im 10/46]
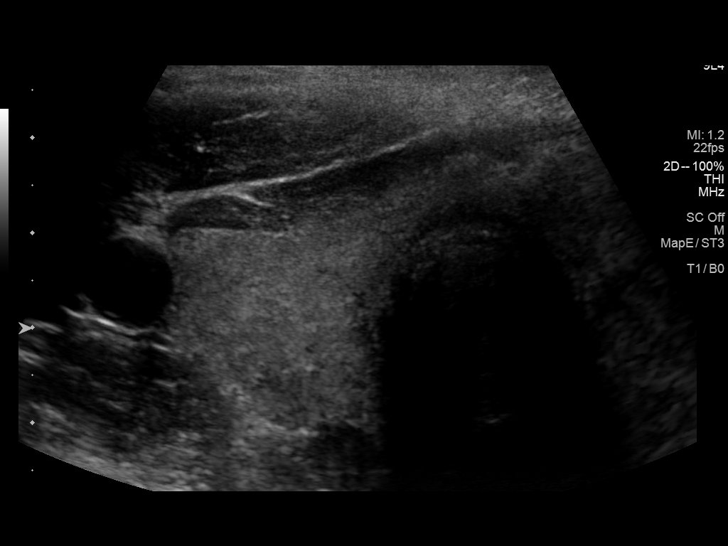
[im 14/46]
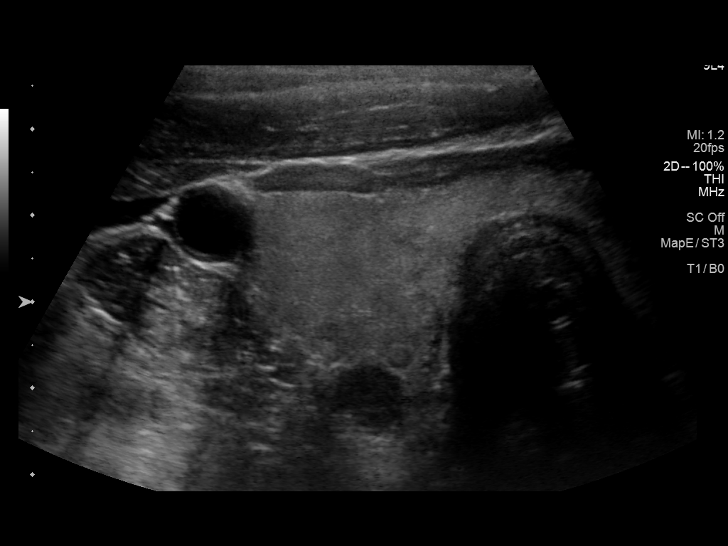
[im 17/46]
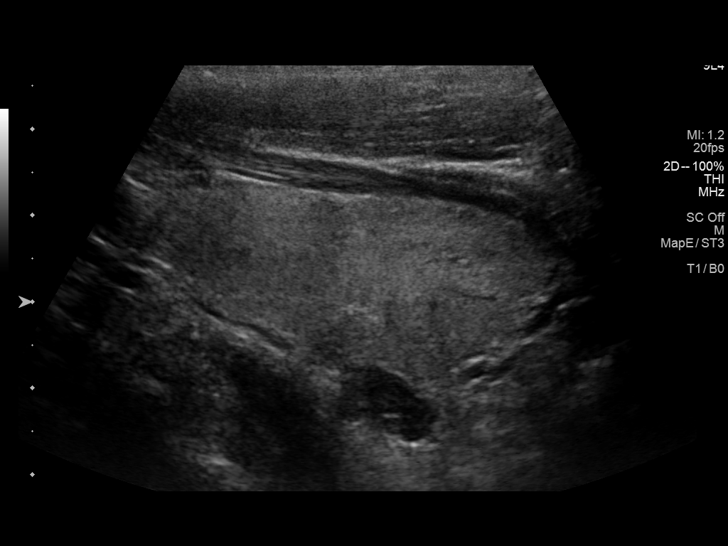
[im 21/46]
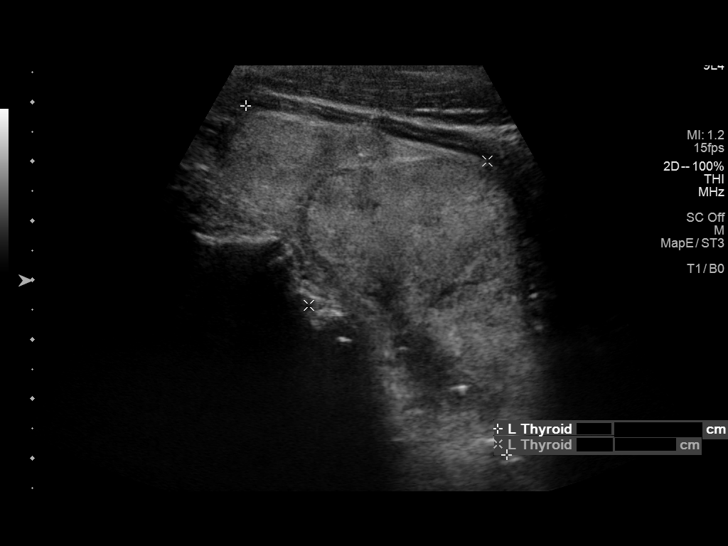
[im 25/46]
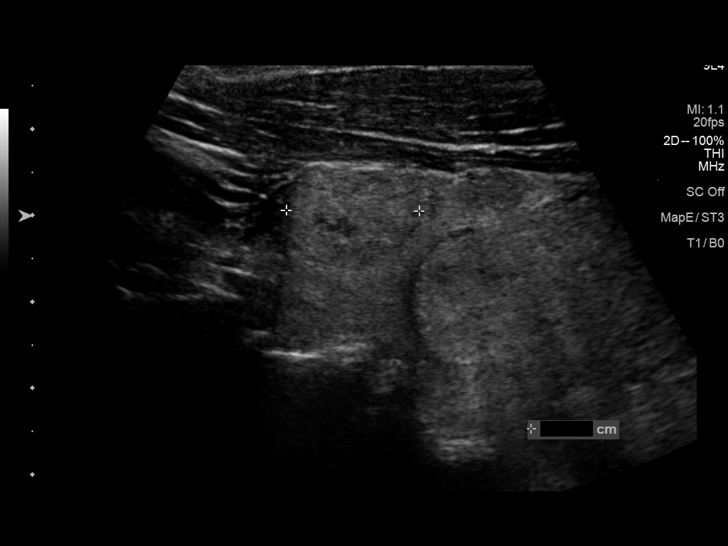
[im 29/46]
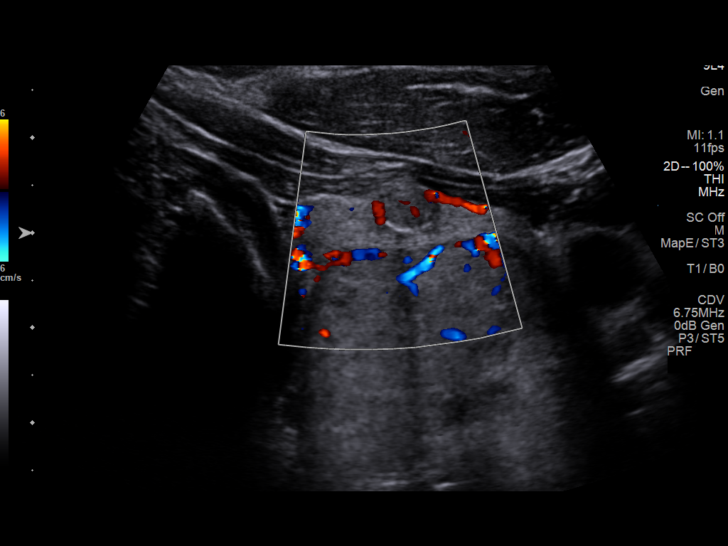
[im 32/46]
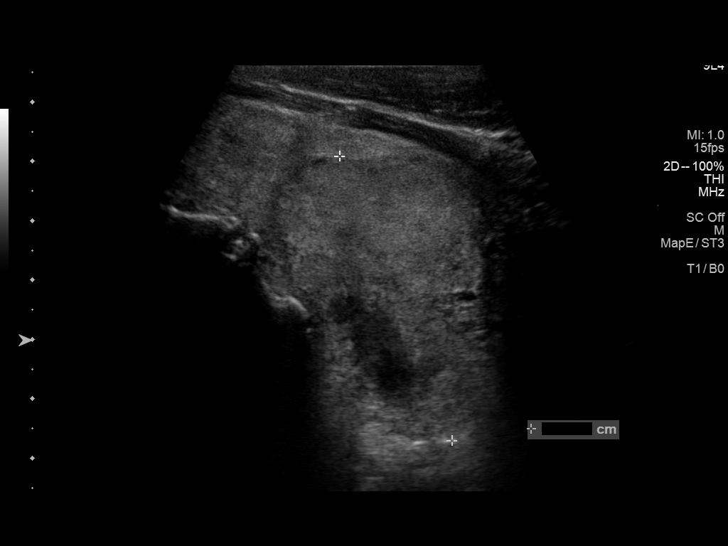
[im 36/46]
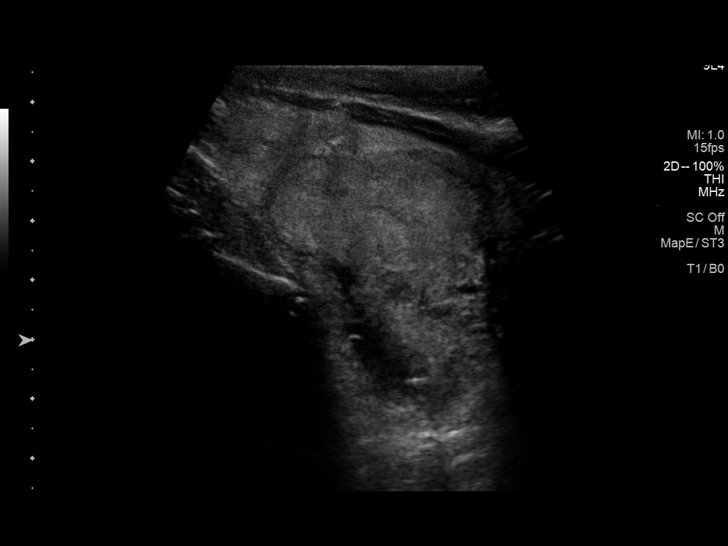
[im 40/46]
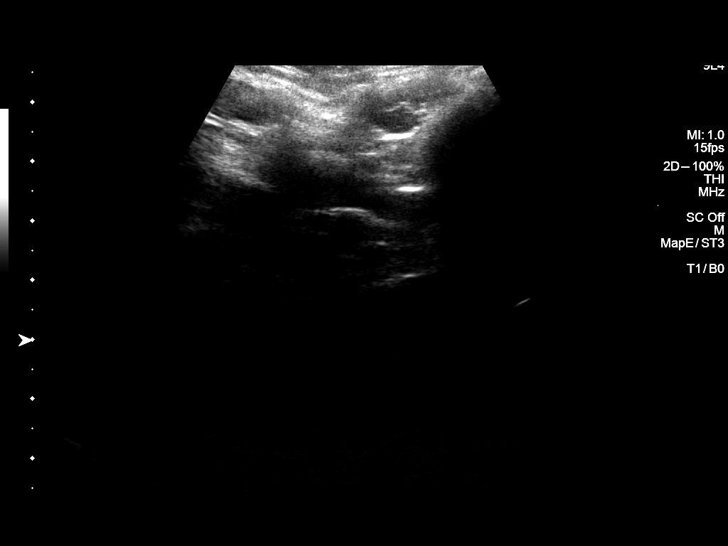
[im 44/46]
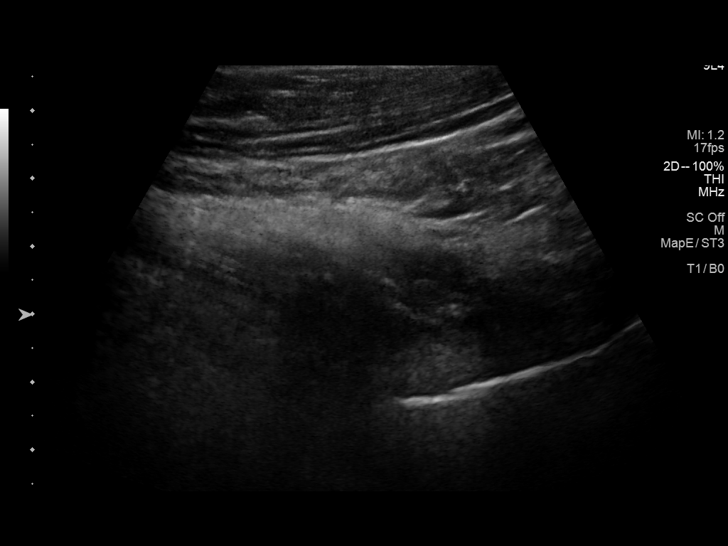

[12 of 25 positions shown; findings below may reference images not displayed]

FINDINGS: Parenchymal Echotexture: Mildly heterogenous

Isthmus: 1 cm thickness

Right lobe: 4.8 x 2.2 x 2.1 cm

Left lobe: 7.3 x 4 x 3.5 cm

_________________________________________________________

Estimated total number of nodules >/= 1 cm: 3

Number of spongiform nodules >/=  2 cm not described below (TR1): 0

Number of mixed cystic and solid nodules >/= 1.5 cm not described
below (TR2): 0

_________________________________________________________

Nodule # 1:

Location: Right; Mid

Maximum size: 1.2 cm; Other 2 dimensions: 0.9 x 0.8 cm

Composition: solid/almost completely solid (2)

Echogenicity: hypoechoic (2)

Shape: not taller-than-wide (0)

Margins: smooth (0)

Echogenic foci: none (0)

ACR TI-RADS total points: 4.

ACR TI-RADS risk category: TR4 (4-6 points).

ACR TI-RADS recommendations:

*Given size (>/= 1 - 1.4 cm) and appearance, a follow-up ultrasound
in 1 year should be considered based on TI-RADS criteria.

If there is clinical concern of parathyroid adenoma, recommend
scintigraphy for further evaluation.

_________________________________________________________

Nodule # 2:

Location: Left; Superior

Maximum size: 1.5 cm; Other 2 dimensions: 1.5 x 1.1 cm

Composition: solid/almost completely solid (2)

Echogenicity: isoechoic (1)

Shape: not taller-than-wide (0)

Margins: ill-defined (0)

Echogenic foci: none (0)

ACR TI-RADS total points: 3.

ACR TI-RADS risk category: TR3 (3 points).

ACR TI-RADS recommendations:

*Given size (>/= 1.5 - 2.4 cm) and appearance, a follow-up
ultrasound in 1 year should be considered based on TI-RADS criteria.

_________________________________________________________

Nodule # 3:

Location: Left; Mid

Maximum size: 0.8 cm; Other 2 dimensions: 0.7 x 0.6 cm

Composition: solid/almost completely solid (2)

Echogenicity: hypoechoic (2)

Shape: not taller-than-wide (0)

Margins: extra-thyroidal extension (3)

Echogenic foci: peripheral calcifications (2)

ACR TI-RADS total points: 9.

ACR TI-RADS risk category: TR5 (>/= 7 points).

ACR TI-RADS recommendations:

*Given size (>/= 0.5 - 0.9 cm) and appearance, a follow-up
ultrasound in 1 year should be considered based on TI-RADS criteria.

_________________________________________________________

Nodule # 4:

Location: Left; Inferior

Maximum size: 5.2 cm; Other 2 dimensions: 4.8 x 3.1 cm

Composition: mixed cystic and solid (1)

Echogenicity: isoechoic (1)

Shape: taller-than-wide (3)

Margins: cannot determine (0)

Echogenic foci: none (0)

ACR TI-RADS total points: 5.

ACR TI-RADS risk category: TR4 (4-6 points).

ACR TI-RADS recommendations:

**Given size (>/= 1.5 cm) and appearance, fine needle aspiration of
this moderately suspicious nodule should be considered based on
TI-RADS criteria.
IMPRESSION: 1. Asymmetric thyromegaly with bilateral nodules. Recommend FNA
biopsy of moderately suspicious 5.2 cm inferior left nodule.
2. Recommend annual/biennial ultrasound follow-up of additional
nodules as above, until stability x5 years confirmed.

The above is in keeping with the ACR TI-RADS recommendations - [HOSPITAL] 6116;[DATE].

## 2021-10-17 ENCOUNTER — Ambulatory Visit: Payer: Federal, State, Local not specified - PPO | Admitting: Family Medicine

## 2021-10-17 ENCOUNTER — Other Ambulatory Visit: Payer: Self-pay

## 2021-10-17 ENCOUNTER — Encounter: Payer: Self-pay | Admitting: Family Medicine

## 2021-10-17 VITALS — BP 150/90 | HR 68 | Temp 97.8°F | Wt 304.2 lb

## 2021-10-17 DIAGNOSIS — Z23 Encounter for immunization: Secondary | ICD-10-CM | POA: Diagnosis not present

## 2021-10-17 DIAGNOSIS — Q7959 Other congenital malformations of abdominal wall: Secondary | ICD-10-CM

## 2021-10-17 NOTE — Progress Notes (Signed)
   Subjective:    Patient ID: Jay Delacruz, male    DOB: [birth year], 65 y.o.   MRN: [id number]  HPI He is here for consult concerning several lesions that he would like evaluated.  He has 1 on his right abdominal wall and also has concerns about his clavicle.   Review of Systems     Objective:   Physical Exam Exam of the right clavicle does show the [place] joint to be more prominent but no other lesions are noted. Exam of his abdomen does show a 3 to 4 cm round lesion that does become more firm when he contracts his abdominal wall.      Assessment & Plan:  Need for influenza vaccination - Plan: Flu Vaccine QUAD 31mo+IM (Fluarix, Fluzone & Alfiuria Quad PF)  Abdominal wall anomaly Explained that the [place] joint is more prominent but not an indication of any other issue.  I reassured him there is nothing to be concerned about.  I then explained that the abdominal wall issue is really part of abdominal wall musculature penetrating through the fascia.  It is benign and nothing else needs to be done.  He was comfortable with that.

## 2021-10-26 ENCOUNTER — Other Ambulatory Visit: Payer: Self-pay | Admitting: Family Medicine

## 2021-10-26 DIAGNOSIS — E781 Pure hyperglyceridemia: Secondary | ICD-10-CM

## 2021-10-26 DIAGNOSIS — E785 Hyperlipidemia, unspecified: Secondary | ICD-10-CM

## 2021-10-26 DIAGNOSIS — M1000 Idiopathic gout, unspecified site: Secondary | ICD-10-CM

## 2021-10-26 NOTE — Telephone Encounter (Signed)
[facility] is requesting to fill pt allopurinol. Please advise [facility]

## 2021-11-11 ENCOUNTER — Telehealth: Payer: Self-pay

## 2021-11-11 NOTE — Telephone Encounter (Signed)
P.A. TESTOSTERONE CYPIONATE  

## 2021-11-17 NOTE — Telephone Encounter (Signed)
P.A. approved til 11/13/22

## 2021-11-27 NOTE — Telephone Encounter (Signed)
Called pt and informed

## 2021-12-05 ENCOUNTER — Other Ambulatory Visit: Payer: Self-pay | Admitting: Family Medicine

## 2021-12-05 DIAGNOSIS — E785 Hyperlipidemia, unspecified: Secondary | ICD-10-CM

## 2021-12-11 ENCOUNTER — Other Ambulatory Visit: Payer: Self-pay | Admitting: Family Medicine

## 2021-12-11 DIAGNOSIS — I10 Essential (primary) hypertension: Secondary | ICD-10-CM

## 2021-12-18 ENCOUNTER — Other Ambulatory Visit: Payer: Self-pay | Admitting: Family Medicine

## 2021-12-18 DIAGNOSIS — E291 Testicular hypofunction: Secondary | ICD-10-CM

## 2021-12-19 NOTE — Telephone Encounter (Signed)
[facility] is requesting to fill pt testosterone. Please advise [facility] 

## 2021-12-21 ENCOUNTER — Ambulatory Visit: Payer: Federal, State, Local not specified - PPO | Admitting: Family Medicine

## 2021-12-21 ENCOUNTER — Encounter: Payer: Self-pay | Admitting: Family Medicine

## 2021-12-21 ENCOUNTER — Other Ambulatory Visit: Payer: Self-pay

## 2021-12-21 VITALS — BP 132/88 | HR 83 | Temp 98.0°F | Ht 72.0 in | Wt 305.0 lb

## 2021-12-21 DIAGNOSIS — I10 Essential (primary) hypertension: Secondary | ICD-10-CM

## 2021-12-21 DIAGNOSIS — Z87891 Personal history of nicotine dependence: Secondary | ICD-10-CM

## 2021-12-21 DIAGNOSIS — E781 Pure hyperglyceridemia: Secondary | ICD-10-CM

## 2021-12-21 DIAGNOSIS — E785 Hyperlipidemia, unspecified: Secondary | ICD-10-CM

## 2021-12-21 DIAGNOSIS — Z23 Encounter for immunization: Secondary | ICD-10-CM | POA: Diagnosis not present

## 2021-12-21 DIAGNOSIS — D126 Benign neoplasm of colon, unspecified: Secondary | ICD-10-CM | POA: Diagnosis not present

## 2021-12-21 DIAGNOSIS — R7309 Other abnormal glucose: Secondary | ICD-10-CM | POA: Diagnosis not present

## 2021-12-21 DIAGNOSIS — K219 Gastro-esophageal reflux disease without esophagitis: Secondary | ICD-10-CM | POA: Diagnosis not present

## 2021-12-21 DIAGNOSIS — M1000 Idiopathic gout, unspecified site: Secondary | ICD-10-CM

## 2021-12-21 DIAGNOSIS — Z8616 Personal history of COVID-19: Secondary | ICD-10-CM | POA: Diagnosis not present

## 2021-12-21 DIAGNOSIS — Z0000 Encounter for general adult medical examination without abnormal findings: Secondary | ICD-10-CM

## 2021-12-21 DIAGNOSIS — Z125 Encounter for screening for malignant neoplasm of prostate: Secondary | ICD-10-CM

## 2021-12-21 DIAGNOSIS — E291 Testicular hypofunction: Secondary | ICD-10-CM

## 2021-12-21 MED ORDER — LOSARTAN POTASSIUM-HCTZ 100-12.5 MG PO TABS: 1.0000 | ORAL_TABLET | Freq: Every day | ORAL | 0 refills | Status: DC

## 2021-12-21 MED ORDER — ATORVASTATIN CALCIUM 40 MG PO TABS
40.0000 mg | ORAL_TABLET | Freq: Every day | ORAL | 3 refills | Status: DC
Start: 2021-12-21 — End: 2022-03-06

## 2021-12-21 MED ORDER — AMLODIPINE BESYLATE 5 MG PO TABS
5.0000 mg | ORAL_TABLET | Freq: Every day | ORAL | 3 refills | Status: DC
Start: 2021-12-21 — End: 2023-08-05

## 2021-12-21 NOTE — Progress Notes (Signed)
° °  Subjective:    Patient ID: Jay Delacruz, male    DOB: Aug 25, 1956, 65 y.o.   MRN: [id number]  HPI He is here for complete examination.  He has no particular concerns or complaints.  He continues on amlodipine and losartan/HCTZ and having no difficulty with that.  Continues on allopurinol for his gout and has not had any flares.  Atorvastatin is causing no difficulties.  He is also taking Vascepa and fenofibrate.  He gives himself weekly shots of testosterone and can tell an improvement in his energy and stamina.  He is midcycle. He does have a history of colonic polyps and should be seen for colonoscopy within the next several months.  He has not had any difficulty recently with reflux.  He has been married for 5 years and this is going well.  He is considering retirement within the next several years.  Otherwise family and social history as well as health maintenance and immunizations was reviewed. Review of Systems  All other systems reviewed and are negative.     Objective:   Physical Exam Alert and in no distress. Tympanic membranes and canals are normal. Pharyngeal area is normal. Neck is supple without adenopathy or thyromegaly. Cardiac exam shows a regular sinus rhythm without murmurs or gallops. Lungs are clear to auscultation.        Assessment & Plan:  Hypogonadism male - Plan: Testosterone  History of COVID-19  Hyperlipidemia with target LDL less than 100 - Plan: Lipid panel  Gastroesophageal reflux disease, unspecified whether esophagitis present  Morbid obesity ([place]) - Plan: CBC with Differential/Platelet, Comprehensive metabolic panel, Lipid panel  Primary hypertension - Plan: CBC with Differential/Platelet, Comprehensive metabolic panel  Idiopathic gout, unspecified chronicity, unspecified site  Adenomatous polyp of colon, unspecified part of colon - Plan: Uric Acid  Need for COVID-19 vaccine - Plan: Moderna Covid-19 Vaccine Bivalent Booster  Former  smoker  Screening for prostate cancer - Plan: PSA  Hypertriglyceridemia  Essential hypertension Continue present medication regimen.  Again discussed the need for him at to make diet and exercise changes.  Specifically recommended cutting back on carbohydrates.  Also discussed future need for AAA evaluation as well as pneumonia shots.  Discussed retirement with him in regard to intellectual, psychological and social issues.

## 2021-12-21 NOTE — Patient Instructions (Signed)
20 minutes of something physical daily or 150 minutes a week.  Work on cutting back on carbohydrates.  Work on getting more fresh fruits and vegetables and lean meats

## 2021-12-22 LAB — LIPID PANEL
Chol/HDL Ratio: 4.2 ratio (ref 0.0–5.0)
Cholesterol, Total: 155 mg/dL (ref 100–199)
HDL: 37 mg/dL — ABNORMAL LOW (ref >39)
LDL Chol Calc (NIH): 62 mg/dL (ref 0–99)
Triglycerides: 364 mg/dL — ABNORMAL HIGH (ref 0–149)
VLDL Cholesterol Cal: 56 mg/dL — ABNORMAL HIGH (ref 5–40)

## 2021-12-22 LAB — COMPREHENSIVE METABOLIC PANEL
ALT: 20 IU/L (ref 0–44)
AST: 22 IU/L (ref 0–40)
Albumin/Globulin Ratio: 1.8 (ref 1.2–2.2)
Albumin: 4.1 g/dL (ref 3.8–4.8)
Alkaline Phosphatase: 86 IU/L (ref 44–121)
BUN/Creatinine Ratio: 19 (ref 10–24)
BUN: 15 mg/dL (ref 8–27)
Bilirubin Total: 0.4 mg/dL (ref 0.0–1.2)
CO2: 22 mmol/L (ref 20–29)
Calcium: 9.7 mg/dL (ref 8.6–10.2)
Chloride: 103 mmol/L (ref 96–106)
Creatinine, Ser: 0.8 mg/dL (ref 0.76–1.27)
Globulin, Total: 2.3 g/dL (ref 1.5–4.5)
Glucose: 112 mg/dL — ABNORMAL HIGH (ref 70–99)
Potassium: 4.4 mmol/L (ref 3.5–5.2)
Sodium: 140 mmol/L (ref 134–144)
Total Protein: 6.4 g/dL (ref 6.0–8.5)
eGFR: 99 mL/min/{1.73_m2} (ref >59)

## 2021-12-22 LAB — CBC WITH DIFFERENTIAL/PLATELET
Basophils Absolute: 0 10*3/uL (ref 0.0–0.2)
Basos: 1 %
EOS (ABSOLUTE): 0.1 10*3/uL (ref 0.0–0.4)
Eos: 3 %
Hematocrit: 40.1 % (ref 37.5–51.0)
Hemoglobin: 13.6 g/dL (ref 13.0–17.7)
Immature Grans (Abs): 0 10*3/uL (ref 0.0–0.1)
Immature Granulocytes: 0 %
Lymphocytes Absolute: 1.5 10*3/uL (ref 0.7–3.1)
Lymphs: 35 %
MCH: 26 pg — ABNORMAL LOW (ref 26.6–33.0)
MCHC: 33.9 g/dL (ref 31.5–35.7)
MCV: 77 fL — ABNORMAL LOW (ref 79–97)
Monocytes Absolute: 0.4 10*3/uL (ref 0.1–0.9)
Monocytes: 9 %
Neutrophils Absolute: 2.2 10*3/uL (ref 1.4–7.0)
Neutrophils: 52 %
Platelets: 202 10*3/uL (ref 150–450)
RBC: 5.23 x10E6/uL (ref 4.14–5.80)
RDW: 14.2 % (ref 11.6–15.4)
WBC: 4.3 10*3/uL (ref 3.4–10.8)

## 2021-12-22 LAB — TESTOSTERONE: Testosterone: 137 ng/dL — ABNORMAL LOW (ref 264–916)

## 2021-12-22 LAB — PSA: Prostate Specific Ag, Serum: 0.8 ng/mL (ref 0.0–4.0)

## 2021-12-22 LAB — URIC ACID: Uric Acid: 6.2 mg/dL (ref 3.8–8.4)

## 2021-12-26 LAB — SPECIMEN STATUS REPORT

## 2021-12-26 LAB — HGB A1C W/O EAG: Hgb A1c MFr Bld: 6.6 % — ABNORMAL HIGH (ref 4.8–5.6)

## 2022-01-02 ENCOUNTER — Encounter: Payer: Self-pay | Admitting: Family Medicine

## 2022-01-02 ENCOUNTER — Telehealth: Payer: Federal, State, Local not specified - PPO | Admitting: Family Medicine

## 2022-01-02 ENCOUNTER — Other Ambulatory Visit: Payer: Self-pay

## 2022-01-02 VITALS — Temp 98.7°F | Wt 305.0 lb

## 2022-01-02 DIAGNOSIS — E119 Type 2 diabetes mellitus without complications: Secondary | ICD-10-CM

## 2022-01-02 DIAGNOSIS — E291 Testicular hypofunction: Secondary | ICD-10-CM | POA: Diagnosis not present

## 2022-01-02 MED ORDER — TESTOSTERONE CYPIONATE 200 MG/ML IM SOLN: INTRAMUSCULAR | 5 refills | Status: DC

## 2022-01-02 NOTE — Progress Notes (Signed)
° °  Subjective:    Patient ID: Jay Delacruz, male    DOB: 21-May-1956, 66 y.o.   MRN: [id number]  HPI Documentation for virtual audio and video telecommunications through [place] encounter: The patient was located at home. 2 patient identifiers used.  The provider was located in the office. The patient did consent to this visit and is aware of possible charges through their insurance for this visit. The other persons participating in this telemedicine service were none. Time spent on call was 5 minutes and in review of previous records >24 minutes total for counseling and coordination of care. This virtual service is not related to other E/M service within previous 7 days.  This visit is to discuss his hemoglobin A1c and his testosterone.  The hemoglobin A1c came back at 6.6 which now diagnosed with him as diabetic.  His testosterone was done on a Thursday.  He normally gives her shots on a Sunday.  He is on a weekly schedule for that.  It was low.  Review of Systems     Objective:   Physical Exam Alert and in no distress otherwise not examined       Assessment & Plan:  New onset type 2 diabetes mellitus ([place])  Hypogonadism male  Morbid obesity ([place]) I discussed the diagnosis of diabetes in regard to diet, exercise, foot care, eye care.  Discussed making changes in his eating habits to cut back on carbohydrates, eating more frequent but smaller meals, getting fruits and veggies as well as lean meats..  I then discussed exercise with him.  Recommend that he take his morning lunch and afternoon breaks and use at least 20 minutes of that time for exercise since he can do that where he works.  Recommend he look at the American diabetes Association website or familydoctor.org website to get more information concerning diabetes And then discussed his testosterone dosing.  He is to go to 3 cc on a weekly schedule and I will schedule him to come back here in 1 month for further discussion of  diabetes and to check him midcycle on his testosterone.

## 2022-01-15 ENCOUNTER — Telehealth: Payer: Self-pay

## 2022-01-15 NOTE — Telephone Encounter (Signed)
Recv'd letter from [facility] that [place] no longer on formulary.  Can pt be switched to either of these covered alternatives icosapent ethyl caps, omega 3 acid ethyl esters cap, Lovaza?

## 2022-01-20 ENCOUNTER — Other Ambulatory Visit: Payer: Self-pay | Admitting: Family Medicine

## 2022-01-20 DIAGNOSIS — M1000 Idiopathic gout, unspecified site: Secondary | ICD-10-CM

## 2022-01-22 NOTE — Telephone Encounter (Signed)
Called pt and informed and put letter in folder to hold for appt

## 2022-02-01 ENCOUNTER — Ambulatory Visit: Payer: Federal, State, Local not specified - PPO | Admitting: Family Medicine

## 2022-02-01 ENCOUNTER — Other Ambulatory Visit: Payer: Self-pay

## 2022-02-01 VITALS — BP 130/80 | HR 72 | Temp 96.5°F | Wt 298.2 lb

## 2022-02-01 DIAGNOSIS — E291 Testicular hypofunction: Secondary | ICD-10-CM | POA: Diagnosis not present

## 2022-02-01 DIAGNOSIS — M1000 Idiopathic gout, unspecified site: Secondary | ICD-10-CM

## 2022-02-01 DIAGNOSIS — E119 Type 2 diabetes mellitus without complications: Secondary | ICD-10-CM

## 2022-02-01 MED ORDER — ALLOPURINOL 300 MG PO TABS: ORAL_TABLET | ORAL | 3 refills | Status: DC

## 2022-02-01 MED ORDER — TESTOSTERONE CYPIONATE 200 MG/ML IM SOLN: INTRAMUSCULAR | 5 refills | Status: DC

## 2022-02-01 NOTE — Progress Notes (Signed)
° °  Subjective:    Patient ID: Jay Delacruz, male    DOB: [birth year], 66 y.o.   MRN: [id number]  HPI He is here for a recheck.  He recently was diagnosed with diabetes and has made diet and exercise changes.  He has cut back on his carbohydrates and plans to stop drinking beer after the weekend.  He states that he was usually drinking 2 beers per day.  He is cut back on carbohydrates and has lost several pounds.  He gives himself testosterone shots weekly and is now midcycle.  He also needs a refill on his allopurinol.   Review of Systems     Objective:   Physical Exam Alert and in no distress otherwise not examined       Assessment & Plan:  New onset type 2 diabetes mellitus (HCC)  Idiopathic gout, unspecified chronicity, unspecified site - Plan: allopurinol (ZYLOPRIM) 300 MG tablet  Hypogonadism male - Plan: testosterone cypionate (DEPOTESTOSTERONE CYPIONATE) 200 MG/ML injection, Testosterone I congratulated him on the good work that he is doing with his diet and exercise.  Explained that at this time we do not need to worry about Vascepa.  I will see what his changes of done to his numbers with his next visit in several months.

## 2022-02-02 LAB — TESTOSTERONE: Testosterone: 118 ng/dL — ABNORMAL LOW (ref 264–916)

## 2022-02-02 MED ORDER — TESTOSTERONE CYPIONATE 200 MG/ML IM SOLN: INTRAMUSCULAR | 5 refills | Status: DC

## 2022-02-02 NOTE — Addendum Note (Signed)
Addended by: Denita Lung on: 02/02/2022 09:25 AM   Modules accepted: Orders

## 2022-02-27 ENCOUNTER — Telehealth: Payer: Self-pay

## 2022-02-27 NOTE — Telephone Encounter (Signed)
I have called and left a voicemail for patient to call back and schedule an appointment with previsit for a recall colonoscopy. ?

## 2022-03-06 ENCOUNTER — Other Ambulatory Visit: Payer: Self-pay | Admitting: Family Medicine

## 2022-03-06 DIAGNOSIS — E785 Hyperlipidemia, unspecified: Secondary | ICD-10-CM

## 2022-03-17 ENCOUNTER — Other Ambulatory Visit: Payer: Self-pay | Admitting: Family Medicine

## 2022-03-17 DIAGNOSIS — I10 Essential (primary) hypertension: Secondary | ICD-10-CM

## 2022-03-19 ENCOUNTER — Encounter: Payer: Self-pay | Admitting: Gastroenterology

## 2022-03-26 ENCOUNTER — Encounter: Payer: Self-pay | Admitting: Gastroenterology

## 2022-03-29 DIAGNOSIS — S8392XA Sprain of unspecified site of left knee, initial encounter: Secondary | ICD-10-CM | POA: Diagnosis not present

## 2022-03-29 DIAGNOSIS — S76102D Unspecified injury of left quadriceps muscle, fascia and tendon, subsequent encounter: Secondary | ICD-10-CM | POA: Diagnosis not present

## 2022-03-29 DIAGNOSIS — M1712 Unilateral primary osteoarthritis, left knee: Secondary | ICD-10-CM | POA: Diagnosis not present

## 2022-03-29 DIAGNOSIS — M238X2 Other internal derangements of left knee: Secondary | ICD-10-CM | POA: Diagnosis not present

## 2022-03-29 DIAGNOSIS — Z9889 Other specified postprocedural states: Secondary | ICD-10-CM | POA: Diagnosis not present

## 2022-04-05 DIAGNOSIS — E785 Hyperlipidemia, unspecified: Secondary | ICD-10-CM | POA: Diagnosis not present

## 2022-04-05 DIAGNOSIS — Z114 Encounter for screening for human immunodeficiency virus [HIV]: Secondary | ICD-10-CM | POA: Diagnosis not present

## 2022-04-05 DIAGNOSIS — I10 Essential (primary) hypertension: Secondary | ICD-10-CM | POA: Diagnosis not present

## 2022-04-05 DIAGNOSIS — M170 Bilateral primary osteoarthritis of knee: Secondary | ICD-10-CM | POA: Diagnosis not present

## 2022-04-05 DIAGNOSIS — E669 Obesity, unspecified: Secondary | ICD-10-CM | POA: Diagnosis not present

## 2022-04-05 DIAGNOSIS — R197 Diarrhea, unspecified: Secondary | ICD-10-CM | POA: Diagnosis not present

## 2022-04-30 DIAGNOSIS — Z122 Encounter for screening for malignant neoplasm of respiratory organs: Secondary | ICD-10-CM | POA: Diagnosis not present

## 2022-04-30 DIAGNOSIS — Z87891 Personal history of nicotine dependence: Secondary | ICD-10-CM | POA: Diagnosis not present

## 2022-05-07 ENCOUNTER — Ambulatory Visit: Payer: Federal, State, Local not specified - PPO | Admitting: Family Medicine

## 2022-05-10 DIAGNOSIS — R918 Other nonspecific abnormal finding of lung field: Secondary | ICD-10-CM | POA: Diagnosis not present

## 2022-05-10 DIAGNOSIS — J984 Other disorders of lung: Secondary | ICD-10-CM | POA: Diagnosis not present

## 2022-05-10 DIAGNOSIS — Z87891 Personal history of nicotine dependence: Secondary | ICD-10-CM | POA: Diagnosis not present

## 2022-05-14 ENCOUNTER — Encounter: Payer: Self-pay | Admitting: Family Medicine

## 2022-05-14 ENCOUNTER — Ambulatory Visit: Payer: Federal, State, Local not specified - PPO | Admitting: Family Medicine

## 2022-05-14 VITALS — BP 162/92 | HR 68 | Temp 96.8°F | Wt 294.8 lb

## 2022-05-14 DIAGNOSIS — E291 Testicular hypofunction: Secondary | ICD-10-CM

## 2022-05-14 DIAGNOSIS — I10 Essential (primary) hypertension: Secondary | ICD-10-CM

## 2022-05-14 DIAGNOSIS — M1000 Idiopathic gout, unspecified site: Secondary | ICD-10-CM | POA: Diagnosis not present

## 2022-05-14 DIAGNOSIS — R7301 Impaired fasting glucose: Secondary | ICD-10-CM | POA: Diagnosis not present

## 2022-05-14 DIAGNOSIS — E785 Hyperlipidemia, unspecified: Secondary | ICD-10-CM

## 2022-05-14 DIAGNOSIS — E119 Type 2 diabetes mellitus without complications: Secondary | ICD-10-CM

## 2022-05-14 LAB — POCT GLYCOSYLATED HEMOGLOBIN (HGB A1C): Hemoglobin A1C: 5.5 % (ref 4.0–5.6)

## 2022-05-14 NOTE — Progress Notes (Unsigned)
Subjective:    Patient ID: Jay Delacruz, male    DOB: 11-Dec-1956, 66 y.o.   MRN: [id number]  Jay Delacruz is a 66 y.o. male who presents for follow-up of Type 2 diabetes mellitus.  Patient is not checking home blood sugars.   Home blood sugar records: patient does not check sugars How often is blood sugars being checked: N/A Current symptoms/problems include none and have been stable. Daily foot checks: yes   Any foot concerns: none  Last eye exam: last October Exercise:  staying active  He has made some dietary changes as well.  He notes a difference in his energy and stamina.  Continues on losartan/HCTZ as well as amlodipine.  Continues on weekly injections of testosterone.  He has not had any episodes of gout since he has been on allopurinol.  He has not been taking his medications on a regular basis stating on the weekends he stopped taking them. The following portions of the patient's history were reviewed and updated as appropriate: allergies, current medications, past medical history, past social history and problem list.  ROS as in subjective above.     Objective:    Physical Exam Alert and in no distress otherwise not examined. Hemoglobin A1c is 5.5.  Weight is recorded.  Lab Review    Latest Ref Rng & Units 12/21/2021   10:56 AM 12/14/2020    1:42 PM 05/25/2019    9:45 AM 01/07/2019   10:30 AM 10/24/2017   11:00 AM  Diabetic Labs  HbA1c 4.8 - 5.6 % 6.6      5.7    Chol 100 - 199 mg/dL 155   139   186   157   207    HDL >39 mg/dL 37   39   34   37   44    Calc LDL 0 - 99 mg/dL 62   44   Comment   49     Triglycerides 0 - 149 mg/dL 364   380   826   353   561    Creatinine 0.76 - 1.27 mg/dL 0.80   0.87    0.78   0.87        05/14/2022   10:30 AM 05/14/2022   10:27 AM 02/01/2022   10:05 AM 01/02/2022    8:15 AM 12/21/2021   10:08 AM  BP/Weight  Systolic BP 151 761 607  371  Diastolic BP 92 90 80  88  Wt. (Lbs)  294.8 298.2 305 305  BMI  39.98 kg/m2 40.44 kg/m2  41.37 kg/m2 41.37 kg/m2       View : No data to display.          Osric  reports that he quit smoking about 6 years ago. His smoking use included cigarettes. He smoked an average of 1.5 packs per day. He has never used smokeless tobacco. He reports current alcohol use of about 6.0 standard drinks per week. He reports current drug use. Frequency: 2.00 times per week. Drug: Marijuana.     Assessment & Plan:    Type 2 diabetes mellitus without complication, without long-term current use of insulin (HCC)  Impaired fasting glucose  Idiopathic gout, unspecified chronicity, unspecified site  Hypogonadism male  Morbid obesity ([place])  Hyperlipidemia with target LDL less than 100  Primary hypertension  Rx changes: none Education: Reviewed 'ABCs' of diabetes management (respective goals in parentheses):  A1C (<7), blood pressure (<130/80), and cholesterol (LDL <100). Compliance at present  is estimated to be excellent. Efforts to improve compliance (if necessary) will be directed at  continue with present diet and exercise . Follow up: 6 months I congratulated him on the good work that he is feeling.  I will check his testosterone midcycle which will be done this Thursday.  Otherwise I will see him here in 6 months.

## 2022-05-17 ENCOUNTER — Other Ambulatory Visit: Payer: Federal, State, Local not specified - PPO

## 2022-05-17 ENCOUNTER — Ambulatory Visit (AMBULATORY_SURGERY_CENTER): Payer: Federal, State, Local not specified - PPO | Admitting: *Deleted

## 2022-05-17 VITALS — Ht 72.0 in | Wt 294.0 lb

## 2022-05-17 DIAGNOSIS — Z86010 Personal history of colonic polyps: Secondary | ICD-10-CM

## 2022-05-17 DIAGNOSIS — E291 Testicular hypofunction: Secondary | ICD-10-CM

## 2022-05-17 DIAGNOSIS — Z800 Family history of malignant neoplasm of digestive organs: Secondary | ICD-10-CM

## 2022-05-17 MED ORDER — NA SULFATE-K SULFATE-MG SULF 17.5-3.13-1.6 GM/177ML PO SOLN: 1.0000 | ORAL | 0 refills | Status: DC

## 2022-05-17 NOTE — Progress Notes (Signed)
Patient's pre-visit was done today over the phone with the patient. Name,DOB and address verified. Patient denies any allergies to Eggs and Soy. Patient denies any problems with anesthesia/sedation. Patient is not taking any diet pills or blood thinners. No home Oxygen. Insurance confirmed with patient.  Prep instructions mailed to pt-pt is aware. Patient understands to call us back with any questions or concerns. Patient is aware of our care-partner policy. Pt will use [facility] for suprep rx.  The patient is COVID-19 vaccinated.

## 2022-05-18 LAB — TESTOSTERONE: Testosterone: 549 ng/dL (ref 264–916)

## 2022-05-31 ENCOUNTER — Encounter: Payer: Self-pay | Admitting: Gastroenterology

## 2022-05-31 ENCOUNTER — Ambulatory Visit (AMBULATORY_SURGERY_CENTER): Payer: Federal, State, Local not specified - PPO | Admitting: Gastroenterology

## 2022-05-31 VITALS — BP 109/69 | HR 72 | Temp 97.5°F | Resp 9 | Ht 72.0 in | Wt 294.0 lb

## 2022-05-31 DIAGNOSIS — D123 Benign neoplasm of transverse colon: Secondary | ICD-10-CM

## 2022-05-31 DIAGNOSIS — D125 Benign neoplasm of sigmoid colon: Secondary | ICD-10-CM | POA: Diagnosis not present

## 2022-05-31 DIAGNOSIS — Z86010 Personal history of colonic polyps: Secondary | ICD-10-CM

## 2022-05-31 DIAGNOSIS — Z800 Family history of malignant neoplasm of digestive organs: Secondary | ICD-10-CM

## 2022-05-31 MED ORDER — SODIUM CHLORIDE 0.9 % IV SOLN: 500.0000 mL | Freq: Once | INTRAVENOUS | Status: DC

## 2022-05-31 NOTE — Progress Notes (Signed)
Pt's states no medical or surgical changes since previsit or office visit. 

## 2022-05-31 NOTE — Patient Instructions (Signed)
Handouts provided on polyps, diverticulosis and hemorrhoids.   YOU HAD AN ENDOSCOPIC PROCEDURE TODAY AT [facility]:   Refer to the procedure report that was given to you for any specific questions about what was found during the examination.  If the procedure report does not answer your questions, please call your gastroenterologist to clarify.  If you requested that your care partner not be given the details of your procedure findings, then the procedure report has been included in a sealed envelope for you to review at your convenience later.  YOU SHOULD EXPECT: Some feelings of bloating in the abdomen. Passage of more gas than usual.  Walking can help get rid of the air that was put into your GI tract during the procedure and reduce the bloating. If you had a lower endoscopy (such as a colonoscopy or flexible sigmoidoscopy) you may notice spotting of blood in your stool or on the toilet paper. If you underwent a bowel prep for your procedure, you may not have a normal bowel movement for a few days.  Please Note:  You might notice some irritation and congestion in your nose or some drainage.  This is from the oxygen used during your procedure.  There is no need for concern and it should clear up in a day or so.  SYMPTOMS TO REPORT IMMEDIATELY:  Following lower endoscopy (colonoscopy or flexible sigmoidoscopy):  Excessive amounts of blood in the stool  Significant tenderness or worsening of abdominal pains  Swelling of the abdomen that is new, acute  Fever of 100F or higher  For urgent or emergent issues, a gastroenterologist can be reached at any hour by calling [phone]. Do not use MyChart messaging for urgent concerns.    DIET:  We do recommend a small meal at first, but then you may proceed to your regular diet.  Drink plenty of fluids but you should avoid alcoholic beverages for 24 hours.  ACTIVITY:  You should plan to take it easy for the rest of today and you  should NOT DRIVE or use heavy machinery until tomorrow (because of the sedation medicines used during the test).    FOLLOW UP: Our staff will call the number listed on your records 24-72 hours following your procedure to check on you and address any questions or concerns that you may have regarding the information given to you following your procedure. If we do not reach you, we will leave a message.  We will attempt to reach you two times.  During this call, we will ask if you have developed any symptoms of COVID 19. If you develop any symptoms (ie: fever, flu-like symptoms, shortness of breath, cough etc.) before then, please call [phone].  If you test positive for Covid 19 in the 2 weeks post procedure, please call and report this information to [place].    If any biopsies were taken you will be contacted by phone or by letter within the next 1-3 weeks.  Please call us at [phone] if you have not heard about the biopsies in 3 weeks.    SIGNATURES/CONFIDENTIALITY: You and/or your care partner have signed paperwork which will be entered into your electronic medical record.  These signatures attest to the fact that that the information above on your After Visit Summary has been reviewed and is understood.  Full responsibility of the confidentiality of this discharge information lies with you and/or your care-partner.

## 2022-05-31 NOTE — Progress Notes (Signed)
To pacu, VSS. Report to [email or web address] 

## 2022-05-31 NOTE — Op Note (Signed)
[name] Procedure Date: 05/31/2022 7:46 AM MRN: [id number] Endoscopist: Jackquline Denmark , MD Age: 66 Referring MD:  Date of Birth: 10-Sep-1956 Gender: Male Account #: [id number] Procedure:                Colonoscopy Indications:              High risk colon cancer surveillance: Personal                            history of colonic polyps. FH colon Ca (Mom and dad                            < 51) Medicines:                Monitored Anesthesia Care Procedure:                Pre-Anesthesia Assessment:                           - Prior to the procedure, a History and Physical                            was performed, and patient medications and                            allergies were reviewed. The patient's tolerance of                            previous anesthesia was also reviewed. The risks                            and benefits of the procedure and the sedation                            options and risks were discussed with the patient.                            All questions were answered, and informed consent                            was obtained. Prior Anticoagulants: The patient has                            taken no previous anticoagulant or antiplatelet                            agents. ASA Grade Assessment: II - A patient with                            mild systemic disease. After reviewing the risks                            and benefits, the patient was deemed in  satisfactory condition to undergo the procedure.                           After obtaining informed consent, the colonoscope                            was passed under direct vision. Throughout the                            procedure, the patient's blood pressure, pulse, and                            oxygen saturations were monitored continuously. The                            CF [id number] #[id number] was introduced through the anus                             and advanced to the 2 cm into the ileum. The                            colonoscopy was performed without difficulty. The                            patient tolerated the procedure well. The quality                            of the bowel preparation was good. The terminal                            ileum, ileocecal valve, appendiceal orifice, and                            rectum were photographed. Scope In: 8:11:05 AM Scope Out: 8:28:46 AM Scope Withdrawal Time: 0 hours 12 minutes 41 seconds  Total Procedure Duration: 0 hours 17 minutes 41 seconds  Findings:                 Two sessile polyps were found in the proximal                            sigmoid colon and proximal transverse colon (8 mm).                            The polyps were 4 to 8 mm in size. These polyps                            were removed with a cold snare. Resection and                            retrieval were complete.                           A few small-mouthed diverticula were found  in the                            sigmoid colon and ascending colon.                           Non-bleeding internal hemorrhoids were found during                            retroflexion. The hemorrhoids were small and Grade                            I (internal hemorrhoids that do not prolapse).                           The terminal ileum appeared normal.                           The exam was otherwise without abnormality on                            direct and retroflexion views. Complications:            No immediate complications. Estimated Blood Loss:     Estimated blood loss: none. Impression:               - Two 4 to 8 mm polyps in the proximal sigmoid                            colon and in the proximal transverse colon, removed                            with a cold snare. Resected and retrieved.                           - Mild colonic diverticulosis                           - Non-bleeding internal  hemorrhoids.                           - The examined portion of the ileum was normal.                           - The examination was otherwise normal on direct                            and retroflexion views. Recommendation:           - Patient has a contact number available for                            emergencies. The signs and symptoms of potential                            delayed complications were discussed with the  patient. Return to normal activities tomorrow.                            Written discharge instructions were provided to the                            patient.                           - Resume previous diet.                           - Continue present medications.                           - Await pathology results.                           - Repeat colonoscopy in 3 years for surveillance                            based on pathology results.                           - The findings and recommendations were discussed                            with the patient's family. Jackquline Denmark, MD 05/31/2022 8:35:54 AM This report has been signed electronically.

## 2022-05-31 NOTE — Progress Notes (Signed)
Called to room to assist during endoscopic procedure.  Patient ID and intended procedure confirmed with present staff. Received instructions for my participation in the procedure from the performing physician.  

## 2022-05-31 NOTE — Progress Notes (Signed)
[facility] History and Physical   Primary Care Physician:  Denita Lung, MD   Reason for Procedure:  Family history of colon cancer-mom and dad > 80, history of polyps  Plan:    Colonoscopy     HPI: Jay Delacruz is a 66 y.o. male  No nausea, vomiting, heartburn, regurgitation, odynophagia or dysphagia.  No significant diarrhea or constipation.  No melena or hematochezia. No unintentional weight loss. No abdominal pain.  Never had genetic testing.  Past Medical History:  Diagnosis Date   Allergy    Atopic dermatitis    Complication of anesthesia    loose tooth bottom front   ED (erectile dysfunction)    GERD (gastroesophageal reflux disease)    past hx    Gout    Hemorrhoids    Hyperlipidemia    Hypertension    Obesity    Wears glasses     Past Surgical History:  Procedure Laterality Date   ARTHROSCOPIC REPAIR ACL  1995   left knee   [facility]   COLONOSCOPY WITH PROPOFOL  02/25/2019   [facility]   DORSAL COMPARTMENT RELEASE  2002   lt wrist   LARYNGOSCOPY  1990   vocal cord polyp   MICROLARYNGOSCOPY WITH CO2 LASER AND EXCISION OF VOCAL CORD LESION N/A 07/03/2013   Procedure: MICROLARYNGOSCOPY WITH EXCISION OF VOCAL CORD LESION;  Surgeon: Rozetta Nunnery, MD;  Location: [facility];  Service: ENT;  Laterality: N/A;   PATELLA RECONSTRUCTION  96,01   right and left knees   POLYPECTOMY     TONSILLECTOMY      Prior to Admission medications   Medication Sig Start Date End Date Taking? Authorizing Provider  allopurinol (ZYLOPRIM) 300 MG tablet TAKE 1 TABLET(300 MG) BY MOUTH DAILY 02/01/22  Yes Denita Lung, MD  amLODipine (NORVASC) 5 MG tablet Take 1 tablet (5 mg total) by mouth daily. 12/21/21  Yes Denita Lung, MD  atorvastatin (LIPITOR) 40 MG tablet TAKE 1 TABLET(40 MG) BY MOUTH DAILY 03/06/22  Yes Denita Lung, MD  ibuprofen (ADVIL) 800 MG tablet TAKE 1 TABLET(800 MG) BY MOUTH EVERY 8 HOURS AS  NEEDED 09/02/20  Yes Denita Lung, MD  Icosapent Ethyl (VASCEPA PO) Take by mouth daily.   Yes [provider]  losartan-hydrochlorothiazide (HYZAAR) 100-12.5 MG tablet TAKE 1 TABLET BY MOUTH DAILY 03/19/22  Yes Denita Lung, MD  B-D 3CC LUER-LOK SYR 18GX1-1/2 18G X 1-1/2" 3 ML MISC USE TO INJECT TESTOSTERONE 03/30/19   Denita Lung, MD  pimecrolimus (ELIDEL) 1 % cream APP AA BID Patient not taking: Reported on 05/16/2021 10/02/18   [provider]  SYRINGE-NEEDLE, DISP, 3 ML (B-D 3CC LUER-LOK SYR 18GX1-1/2) 18G X 1-1/2" 3 ML MISC DRAW UP TESTERONE WITH 18 GAUGE AND AND INJECT WITH ANOTHER 18 GAUGE NEEDLE 05/25/21   Denita Lung, MD  tadalafil (CIALIS) 20 MG tablet Take 1 tablet (20 mg total) by mouth daily as needed for erectile dysfunction. Patient not taking: Reported on 05/16/2021 09/15/15   Denita Lung, MD  testosterone cypionate (DEPOTESTOSTERONE CYPIONATE) 200 MG/ML injection ADMINISTER 4 ML IN THE MUSCLE WEEKLY 02/02/22   Denita Lung, MD    Current Outpatient Medications  Medication Sig Dispense Refill   allopurinol (ZYLOPRIM) 300 MG tablet TAKE 1 TABLET(300 MG) BY MOUTH DAILY 90 tablet 3   amLODipine (NORVASC) 5 MG tablet Take 1 tablet (5 mg total) by mouth daily. [place]  tablet 3   atorvastatin (LIPITOR) 40 MG tablet TAKE 1 TABLET(40 MG) BY MOUTH DAILY 90 tablet 1   ibuprofen (ADVIL) 800 MG tablet TAKE 1 TABLET(800 MG) BY MOUTH EVERY 8 HOURS AS NEEDED 60 tablet 1   Icosapent Ethyl (VASCEPA PO) Take by mouth daily.     losartan-hydrochlorothiazide (HYZAAR) 100-12.5 MG tablet TAKE 1 TABLET BY MOUTH DAILY 90 tablet 0   B-D 3CC LUER-LOK SYR 18GX1-1/2 18G X 1-1/2" 3 ML MISC USE TO INJECT TESTOSTERONE 100 each 1   pimecrolimus (ELIDEL) 1 % cream APP AA BID (Patient not taking: Reported on 05/16/2021)  2   SYRINGE-NEEDLE, DISP, 3 ML (B-D 3CC LUER-LOK SYR 18GX1-1/2) 18G X 1-1/2" 3 ML MISC DRAW UP TESTERONE WITH 18 GAUGE AND AND INJECT WITH ANOTHER 18 GAUGE NEEDLE 100  each 1   tadalafil (CIALIS) 20 MG tablet Take 1 tablet (20 mg total) by mouth daily as needed for erectile dysfunction. (Patient not taking: Reported on 05/16/2021) 10 tablet 5   testosterone cypionate (DEPOTESTOSTERONE CYPIONATE) 200 MG/ML injection ADMINISTER 4 ML IN THE MUSCLE WEEKLY 20 mL 5   Current Facility-Administered Medications  Medication Dose Route Frequency Provider Last Rate Last Admin   0.9 %  sodium chloride infusion  500 mL Intravenous Once Jackquline Denmark, MD        Allergies as of 05/31/2022 - Review Complete 05/31/2022  Allergen Reaction Noted   Lisinopril Swelling 07/06/2011    Family History  Problem Relation Age of Onset   Pancreatic cancer Mother    Cancer Mother 80       Pancreatic cancer   Cancer Father 63       Colon cancer- dx'd age 79, died age 47    Colon cancer Father 98   Colon polyps Neg Hx    Esophageal cancer Neg Hx    Rectal cancer Neg Hx    Stomach cancer Neg Hx     Social History   Socioeconomic History   Marital status: Married    Spouse name: Not on file   Number of children: Not on file   Years of education: Not on file   Highest education level: Not on file  Occupational History   Not on file  Tobacco Use   Smoking status: Former    Packs/day: 1.50    Types: Cigarettes    Quit date: 07/03/2015    Years since quitting: 6.9   Smokeless tobacco: Never  Vaping Use   Vaping Use: Never used  Substance and Sexual Activity   Alcohol use: Yes    Alcohol/week: 7.0 standard drinks of alcohol    Types: 7 Cans of beer per week   Drug use: Yes    Frequency: 2.0 times per week    Types: Marijuana    Comment: last smoked marijuana 05/29/2022   Sexual activity: Yes  Other Topics Concern   Not on file  Social History Narrative   Not on file   Social Determinants of Health   Financial Resource Strain: Not on file  Food Insecurity: Not on file  Transportation Needs: Not on file  Physical Activity: Not on file  Stress: Not on file   Social Connections: Not on file  Intimate Partner Violence: Not on file    Review of Systems: Positive for none All other review of systems negative except as mentioned in the HPI.  Physical Exam: Vital signs in last 24 hours: '@VSRANGES'$ @   General:   Alert,  Well-developed, well-nourished, pleasant and cooperative  in NAD Lungs:  Clear throughout to auscultation.   Heart:  Regular rate and rhythm; no murmurs, clicks, rubs,  or gallops. Abdomen:  Soft, nontender and nondistended. Normal bowel sounds.   Neuro/Psych:  Alert and cooperative. Normal mood and affect. A and O x 3    No significant changes were identified.  The patient continues to be an appropriate candidate for the planned procedure and anesthesia.   Carmell Austria, MD. [facility] 05/31/2022 8:08 AM@

## 2022-06-01 ENCOUNTER — Telehealth: Payer: Self-pay

## 2022-06-01 NOTE — Telephone Encounter (Signed)
  Follow up Call-     05/31/2022    7:17 AM  Call back number  Post procedure Call Back phone  # [phone] cell  Permission to leave phone message Yes     Patient questions:  Do you have a fever, pain , or abdominal swelling? No. Pain Score  0 *  Have you tolerated food without any problems? Yes.    Have you been able to return to your normal activities? Yes.    Do you have any questions about your discharge instructions: Diet   No. Medications  No. Follow up visit  No.  Do you have questions or concerns about your Care? No.  Actions: * If pain score is 4 or above: No action needed, pain <4.

## 2022-06-04 ENCOUNTER — Other Ambulatory Visit: Payer: Self-pay

## 2022-06-08 ENCOUNTER — Other Ambulatory Visit: Payer: Self-pay | Admitting: Family Medicine

## 2022-06-08 DIAGNOSIS — I10 Essential (primary) hypertension: Secondary | ICD-10-CM

## 2022-06-08 NOTE — Telephone Encounter (Signed)
This was refilled for a year on 11/2021 #90 with 3 refills

## 2022-06-09 ENCOUNTER — Encounter: Payer: Self-pay | Admitting: Gastroenterology

## 2022-06-12 ENCOUNTER — Telehealth: Payer: Self-pay

## 2022-06-12 NOTE — Telephone Encounter (Unsigned)
Pt left message having issues getting testosterone

## 2022-06-15 ENCOUNTER — Other Ambulatory Visit: Payer: Self-pay | Admitting: Family Medicine

## 2022-06-15 DIAGNOSIS — I10 Essential (primary) hypertension: Secondary | ICD-10-CM

## 2022-07-17 ENCOUNTER — Encounter: Payer: Self-pay | Admitting: Family Medicine

## 2022-07-17 NOTE — Telephone Encounter (Signed)
P.A. was denied, called plan and appeal needed fax # [phone], letter was typed and faxed with medical records

## 2022-08-14 ENCOUNTER — Telehealth: Payer: Self-pay

## 2022-08-14 DIAGNOSIS — M1990 Unspecified osteoarthritis, unspecified site: Secondary | ICD-10-CM

## 2022-08-14 MED ORDER — IBUPROFEN 800 MG PO TABS: ORAL_TABLET | ORAL | 1 refills | Status: DC

## 2022-08-14 NOTE — Telephone Encounter (Signed)
Pt. Called wanting to know if you could refill his Ibuprofen to [facility] on [address] in [place]. Last apt was 05/14/22 next apt 11/19/22.

## 2022-08-24 ENCOUNTER — Other Ambulatory Visit: Payer: Self-pay | Admitting: Family Medicine

## 2022-08-24 DIAGNOSIS — E291 Testicular hypofunction: Secondary | ICD-10-CM

## 2022-08-24 NOTE — Telephone Encounter (Signed)
Testosterone inj refill request last apt 05/14/22 next apt 11/19/22.

## 2022-08-28 DIAGNOSIS — I10 Essential (primary) hypertension: Secondary | ICD-10-CM | POA: Diagnosis not present

## 2022-08-28 DIAGNOSIS — F1090 Alcohol use, unspecified, uncomplicated: Secondary | ICD-10-CM | POA: Diagnosis not present

## 2022-08-28 DIAGNOSIS — M109 Gout, unspecified: Secondary | ICD-10-CM | POA: Diagnosis not present

## 2022-09-13 ENCOUNTER — Other Ambulatory Visit: Payer: Self-pay | Admitting: Family Medicine

## 2022-09-13 DIAGNOSIS — I10 Essential (primary) hypertension: Secondary | ICD-10-CM

## 2022-09-18 ENCOUNTER — Ambulatory Visit: Payer: Federal, State, Local not specified - PPO | Admitting: Family Medicine

## 2022-10-04 DIAGNOSIS — I10 Essential (primary) hypertension: Secondary | ICD-10-CM | POA: Diagnosis not present

## 2022-10-14 ENCOUNTER — Other Ambulatory Visit: Payer: Self-pay | Admitting: Family Medicine

## 2022-10-14 DIAGNOSIS — E291 Testicular hypofunction: Secondary | ICD-10-CM

## 2022-10-15 ENCOUNTER — Telehealth: Payer: Self-pay | Admitting: Family Medicine

## 2022-10-15 NOTE — Telephone Encounter (Signed)
Pt called regarding testosterone, his P.A. was already approved til 11/13/22  [facility] pharmacy & pt needs refill.

## 2022-10-15 NOTE — Telephone Encounter (Signed)
Refill request last apt 05/14/22 next apt 11/19/22.

## 2022-11-13 DIAGNOSIS — G4733 Obstructive sleep apnea (adult) (pediatric): Secondary | ICD-10-CM | POA: Diagnosis not present

## 2022-11-19 ENCOUNTER — Ambulatory Visit: Payer: Federal, State, Local not specified - PPO | Admitting: Family Medicine

## 2022-11-19 ENCOUNTER — Encounter: Payer: Self-pay | Admitting: Family Medicine

## 2022-11-19 VITALS — BP 124/82 | HR 78 | Temp 98.6°F | Wt 277.8 lb

## 2022-11-19 DIAGNOSIS — E291 Testicular hypofunction: Secondary | ICD-10-CM

## 2022-11-19 DIAGNOSIS — E118 Type 2 diabetes mellitus with unspecified complications: Secondary | ICD-10-CM | POA: Diagnosis not present

## 2022-11-19 DIAGNOSIS — K219 Gastro-esophageal reflux disease without esophagitis: Secondary | ICD-10-CM

## 2022-11-19 DIAGNOSIS — E785 Hyperlipidemia, unspecified: Secondary | ICD-10-CM

## 2022-11-19 DIAGNOSIS — I10 Essential (primary) hypertension: Secondary | ICD-10-CM

## 2022-11-19 DIAGNOSIS — Z23 Encounter for immunization: Secondary | ICD-10-CM | POA: Diagnosis not present

## 2022-11-19 DIAGNOSIS — D126 Benign neoplasm of colon, unspecified: Secondary | ICD-10-CM | POA: Diagnosis not present

## 2022-11-19 DIAGNOSIS — Z86010 Personal history of colonic polyps: Secondary | ICD-10-CM

## 2022-11-19 DIAGNOSIS — M1000 Idiopathic gout, unspecified site: Secondary | ICD-10-CM

## 2022-11-19 DIAGNOSIS — Z8616 Personal history of COVID-19: Secondary | ICD-10-CM

## 2022-11-19 LAB — POCT GLYCOSYLATED HEMOGLOBIN (HGB A1C): Hemoglobin A1C: 5.8 % — AB (ref 4.0–5.6)

## 2022-11-19 LAB — POCT UA - MICROALBUMIN
Albumin/Creatinine Ratio, Urine, POC: 4
Creatinine, POC: 275 mg/dL
Microalbumin Ur, POC: 11 mg/L

## 2022-11-19 NOTE — Progress Notes (Signed)
Subjective:    Patient ID: Jay Delacruz, male    DOB: 1956-06-06, 65 y.o.   MRN: [id number]  Jay Delacruz is a 66 y.o. male who presents for follow-up of Type 2 diabetes mellitus.  Patient is not checking home blood sugars.   Home blood sugar records: patient does not check sugars How often is blood sugars being checked: not checking at home Current symptoms/problems include none and have been stable. Daily foot checks: YES   Any foot concerns: No Last eye exam: Last year Exercise: Home exercise routine includes walking 1 hrs per days.  His last hemoglobin A1c was 5.5.  He continues on testosterone and gets the shots usually "Sunday however he has not had his 1 for this week.  Continues on Lipitor as well as Vascepa.  Continues on allopurinol is having no difficulty with that.  He is also taking losartan/HCTZ as well as amlodipine.  He does have a history of colonic polyps and is scheduled for repeat colonoscopy in roughly 3 years.  The following portions of the patient's history were reviewed and updated as appropriate: allergies, current medications, past medical history, past social history and problem list.  ROS as in subjective above.     Objective:    Physical Exam Alert and in no distress diabetic foot exam is normal. Hemoglobin A1c is 5.8 Blood pressure 124/82, pulse 78, temperature 98.6 F (37 C), weight 277 lb 12.8 oz (126 kg).  Lab Review    Latest Ref Rng & Units 05/14/2022    4:44 PM 12/21/2021   10:56 AM 12/14/2020    1:42 PM 05/25/2019    9:45 AM 01/07/2019   10:30 AM  Diabetic Labs  HbA1c 4.0 - 5.6 % 5.5  6.6      Chol 100 - 199 mg/dL  155  139  186  157   HDL >39 mg/dL  37  39  34  37   Calc LDL 0 - 99 mg/dL  62  44  Comment  49   Triglycerides 0 - 149 mg/dL  364  380  826  353   Creatinine 0.76 - 1.27 mg/dL  0.80  0.87   0.78       11/19/2022   10:36 AM 05/31/2022    8:52 AM 05/31/2022    8:42 AM 05/31/2022    8:32 AM 05/31/2022    8:28 AM  BP/Weight   Systolic BP 124 109 97 90 98  Diastolic BP 82 69 58 53 55  Wt. (Lbs) 277.8      BMI 37.68 kg/m2          11"$ /27/2023   10:45 AM  Foot/eye exam completion dates  [place]  reports that he quit smoking about 7 years ago. His smoking use included cigarettes. He smoked an average of 1.5 packs per day. He has never used smokeless tobacco. He reports current alcohol use of about 7.0 standard drinks of alcohol per week. He reports current drug use. Frequency: 2.00 times per week. Drug: Marijuana.     Assessment & Plan:    Controlled type 2 diabetes mellitus with complication, without long-term current use of insulin ([place]) - Plan: CBC with Differential/Platelet, Comprehensive metabolic panel, Lipid panel, POCT glycosylated hemoglobin (Hb A1C), POCT UA - Microalbumin  Primary hypertension - Plan: CBC with Differential/Platelet, Comprehensive metabolic panel  Adenomatous polyp of colon, unspecified part of colon  Gastroesophageal reflux disease, unspecified whether esophagitis present  Hypogonadism  male - Plan: Testosterone, PSA  Idiopathic gout, unspecified chronicity, unspecified site - Plan: Uric Acid  Hyperlipidemia with target LDL less than 100 - Plan: Lipid panel  Morbid obesity ([place])  History of COVID-19  History of colonic polyps  Need for influenza vaccination - Plan: Flu Vaccine QUAD 6+ mos PF IM (Fluarix Quad PF)  Need for Tdap vaccination - Plan: Tdap vaccine greater than or equal to 7yo IM  Rx changes: none Education: Reviewed 'ABCs' of diabetes management (respective goals in parentheses):  A1C (<7), blood pressure (<130/80), and cholesterol (LDL <100). Compliance at present is estimated to be excellent. Efforts to improve compliance (if necessary) will be directed at  continue with present diet and exercise regimen . Follow up: 6 months  Discussed the possibility of with continued weight reduction and his A1c staying low, we could  potentially relabel him his diabetes in remission.  Also discussed the fact that with weight loss his testosterone numbers might come back into the normal range.

## 2022-11-19 NOTE — Progress Notes (Signed)
  Subjective:    Patient ID: Jay Delacruz, male    DOB: 1956/10/16, 66 y.o.   MRN:

## 2022-11-20 LAB — PSA: Prostate Specific Ag, Serum: 0.6 ng/mL (ref 0.0–4.0)

## 2022-11-20 LAB — COMPREHENSIVE METABOLIC PANEL
ALT: 14 IU/L (ref 0–44)
AST: 21 IU/L (ref 0–40)
Albumin/Globulin Ratio: 1.8 (ref 1.2–2.2)
Albumin: 4.1 g/dL (ref 3.9–4.9)
Alkaline Phosphatase: 79 IU/L (ref 44–121)
BUN/Creatinine Ratio: 11 (ref 10–24)
BUN: 10 mg/dL (ref 8–27)
Bilirubin Total: 0.3 mg/dL (ref 0.0–1.2)
CO2: 19 mmol/L — ABNORMAL LOW (ref 20–29)
Calcium: 9.8 mg/dL (ref 8.6–10.2)
Chloride: 107 mmol/L — ABNORMAL HIGH (ref 96–106)
Creatinine, Ser: 0.93 mg/dL (ref 0.76–1.27)
Globulin, Total: 2.3 g/dL (ref 1.5–4.5)
Glucose: 103 mg/dL — ABNORMAL HIGH (ref 70–99)
Potassium: 4.3 mmol/L (ref 3.5–5.2)
Sodium: 141 mmol/L (ref 134–144)
Total Protein: 6.4 g/dL (ref 6.0–8.5)
eGFR: 92 mL/min/{1.73_m2} (ref >59)

## 2022-11-20 LAB — URIC ACID: Uric Acid: 5.7 mg/dL (ref 3.8–8.4)

## 2022-11-20 LAB — CBC WITH DIFFERENTIAL/PLATELET
Basophils Absolute: 0 10*3/uL (ref 0.0–0.2)
Basos: 1 %
EOS (ABSOLUTE): 0.1 10*3/uL (ref 0.0–0.4)
Eos: 3 %
Hematocrit: 41.7 % (ref 37.5–51.0)
Hemoglobin: 14.1 g/dL (ref 13.0–17.7)
Immature Grans (Abs): 0 10*3/uL (ref 0.0–0.1)
Immature Granulocytes: 0 %
Lymphocytes Absolute: 1.4 10*3/uL (ref 0.7–3.1)
Lymphs: 45 %
MCH: 27 pg (ref 26.6–33.0)
MCHC: 33.8 g/dL (ref 31.5–35.7)
MCV: 80 fL (ref 79–97)
Monocytes Absolute: 0.3 10*3/uL (ref 0.1–0.9)
Monocytes: 10 %
Neutrophils Absolute: 1.2 10*3/uL — ABNORMAL LOW (ref 1.4–7.0)
Neutrophils: 41 %
Platelets: 213 10*3/uL (ref 150–450)
RBC: 5.22 x10E6/uL (ref 4.14–5.80)
RDW: 15.1 % (ref 11.6–15.4)
WBC: 3 10*3/uL — ABNORMAL LOW (ref 3.4–10.8)

## 2022-11-20 LAB — LIPID PANEL
Chol/HDL Ratio: 3.5 ratio (ref 0.0–5.0)
Cholesterol, Total: 166 mg/dL (ref 100–199)
HDL: 48 mg/dL (ref >39)
LDL Chol Calc (NIH): 81 mg/dL (ref 0–99)
Triglycerides: 223 mg/dL — ABNORMAL HIGH (ref 0–149)
VLDL Cholesterol Cal: 37 mg/dL (ref 5–40)

## 2022-11-20 LAB — TESTOSTERONE: Testosterone: 206 ng/dL — ABNORMAL LOW (ref 264–916)

## 2022-12-02 ENCOUNTER — Telehealth: Payer: Self-pay | Admitting: Family Medicine

## 2022-12-02 NOTE — Telephone Encounter (Signed)
P.A. TESTOSTERONE CYPIONATE denied

## 2022-12-29 NOTE — Telephone Encounter (Signed)
done

## 2022-12-30 ENCOUNTER — Other Ambulatory Visit: Payer: Self-pay | Admitting: Family Medicine

## 2022-12-30 DIAGNOSIS — E291 Testicular hypofunction: Secondary | ICD-10-CM

## 2022-12-31 ENCOUNTER — Encounter: Payer: Federal, State, Local not specified - PPO | Admitting: Family Medicine

## 2022-12-31 NOTE — Telephone Encounter (Signed)
Is it ok to refill? 

## 2023-01-02 ENCOUNTER — Telehealth: Payer: Self-pay | Admitting: Family Medicine

## 2023-01-02 NOTE — Telephone Encounter (Signed)
Never heard back from appeal will resubmit

## 2023-01-02 NOTE — Telephone Encounter (Signed)
P.A. TESTOSTERONE CYPIONATE Called plan t# [phone] ID# [id number] & got approval for 77m for 90 days, they will not approve the 476mfor 90 days.  Said faAutomotive engineer Called pharmacy & went thru for $40 for 2036m Called pt and informed.  Will appeal once denial received.

## 2023-01-03 DIAGNOSIS — F1090 Alcohol use, unspecified, uncomplicated: Secondary | ICD-10-CM | POA: Diagnosis not present

## 2023-01-03 DIAGNOSIS — I10 Essential (primary) hypertension: Secondary | ICD-10-CM | POA: Diagnosis not present

## 2023-01-03 DIAGNOSIS — E785 Hyperlipidemia, unspecified: Secondary | ICD-10-CM | POA: Diagnosis not present

## 2023-01-03 DIAGNOSIS — G4733 Obstructive sleep apnea (adult) (pediatric): Secondary | ICD-10-CM | POA: Diagnosis not present

## 2023-01-14 DIAGNOSIS — M19072 Primary osteoarthritis, left ankle and foot: Secondary | ICD-10-CM | POA: Diagnosis not present

## 2023-01-14 DIAGNOSIS — M2012 Hallux valgus (acquired), left foot: Secondary | ICD-10-CM | POA: Diagnosis not present

## 2023-01-29 ENCOUNTER — Encounter: Payer: Federal, State, Local not specified - PPO | Admitting: Family Medicine

## 2023-02-13 DIAGNOSIS — L603 Nail dystrophy: Secondary | ICD-10-CM | POA: Diagnosis not present

## 2023-02-13 DIAGNOSIS — L600 Ingrowing nail: Secondary | ICD-10-CM | POA: Diagnosis not present

## 2023-02-13 DIAGNOSIS — M2042 Other hammer toe(s) (acquired), left foot: Secondary | ICD-10-CM | POA: Diagnosis not present

## 2023-02-13 DIAGNOSIS — M2012 Hallux valgus (acquired), left foot: Secondary | ICD-10-CM | POA: Diagnosis not present

## 2023-03-13 ENCOUNTER — Other Ambulatory Visit: Payer: Self-pay | Admitting: Family Medicine

## 2023-03-13 DIAGNOSIS — I10 Essential (primary) hypertension: Secondary | ICD-10-CM

## 2023-03-14 DIAGNOSIS — Z23 Encounter for immunization: Secondary | ICD-10-CM | POA: Diagnosis not present

## 2023-03-14 DIAGNOSIS — M170 Bilateral primary osteoarthritis of knee: Secondary | ICD-10-CM | POA: Diagnosis not present

## 2023-03-14 DIAGNOSIS — M109 Gout, unspecified: Secondary | ICD-10-CM | POA: Diagnosis not present

## 2023-03-14 DIAGNOSIS — E669 Obesity, unspecified: Secondary | ICD-10-CM | POA: Diagnosis not present

## 2023-03-20 ENCOUNTER — Other Ambulatory Visit: Payer: Self-pay | Admitting: Family Medicine

## 2023-03-20 DIAGNOSIS — E785 Hyperlipidemia, unspecified: Secondary | ICD-10-CM

## 2023-03-26 DIAGNOSIS — E118 Type 2 diabetes mellitus with unspecified complications: Secondary | ICD-10-CM | POA: Insufficient documentation

## 2023-03-27 ENCOUNTER — Ambulatory Visit (INDEPENDENT_AMBULATORY_CARE_PROVIDER_SITE_OTHER): Payer: Federal, State, Local not specified - PPO | Admitting: Family Medicine

## 2023-03-27 ENCOUNTER — Encounter: Payer: Self-pay | Admitting: Family Medicine

## 2023-03-27 VITALS — BP 110/72 | HR 81 | Temp 98.0°F | Resp 16 | Ht 70.5 in | Wt 269.0 lb

## 2023-03-27 DIAGNOSIS — M1000 Idiopathic gout, unspecified site: Secondary | ICD-10-CM

## 2023-03-27 DIAGNOSIS — K219 Gastro-esophageal reflux disease without esophagitis: Secondary | ICD-10-CM

## 2023-03-27 DIAGNOSIS — Z125 Encounter for screening for malignant neoplasm of prostate: Secondary | ICD-10-CM | POA: Diagnosis not present

## 2023-03-27 DIAGNOSIS — Z87891 Personal history of nicotine dependence: Secondary | ICD-10-CM

## 2023-03-27 DIAGNOSIS — Z0000 Encounter for general adult medical examination without abnormal findings: Secondary | ICD-10-CM

## 2023-03-27 DIAGNOSIS — I10 Essential (primary) hypertension: Secondary | ICD-10-CM

## 2023-03-27 DIAGNOSIS — R319 Hematuria, unspecified: Secondary | ICD-10-CM

## 2023-03-27 DIAGNOSIS — D126 Benign neoplasm of colon, unspecified: Secondary | ICD-10-CM

## 2023-03-27 DIAGNOSIS — E118 Type 2 diabetes mellitus with unspecified complications: Secondary | ICD-10-CM

## 2023-03-27 DIAGNOSIS — E291 Testicular hypofunction: Secondary | ICD-10-CM

## 2023-03-27 DIAGNOSIS — E785 Hyperlipidemia, unspecified: Secondary | ICD-10-CM

## 2023-03-27 LAB — POCT URINALYSIS DIP (PROADVANTAGE DEVICE)
Bilirubin, UA: NEGATIVE
Glucose, UA: NEGATIVE mg/dL
Ketones, POC UA: NEGATIVE mg/dL
Leukocytes, UA: NEGATIVE
Nitrite, UA: NEGATIVE
Protein Ur, POC: NEGATIVE mg/dL
Specific Gravity, Urine: 1.015
Urobilinogen, Ur: 0.2
pH, UA: 7.5 (ref 5.0–8.0)

## 2023-03-27 MED ORDER — ROSUVASTATIN CALCIUM 40 MG PO TABS: 40.0000 mg | ORAL_TABLET | Freq: Every day | ORAL | 3 refills | Status: AC

## 2023-03-27 NOTE — Progress Notes (Signed)
Complete physical exam  Patient: Jay Delacruz   DOB: 07-Feb-1956   67 y.o. Male  MRN: [id number]  Subjective:    Chief Complaint  Patient presents with   Annual Exam    Fasting. No additional concerns.     Jay Delacruz is a 67 y.o. male who presents today for a complete physical exam. He reports consuming a general diet. Home exercise routine includes walking 5 miles five times a week. He generally feels well. He reports sleeping fairly well. He plans to get his primary care done through the [facility] in [place].  Recent hemoglobin A1c was 5.8.  He continues on atorvastatin as well as losartan/HCTZ, amlodipine, testosterone and Zyloprim.  He is also using Vascepa.  He has a AAA evaluation on April 8.  He does see his eye doctor regularly.  He does have a history of colonic polyps and is scheduled for follow-up colonoscopy.  He is planning on retiring in the near future.  His home life is going well.   Most recent fall risk assessment:    03/27/2023    2:26 PM  [place] in the past year? 0  Number falls in past yr: 0  Injury with Fall? 0  Risk for fall due to : No Fall Risks  Follow up Falls evaluation completed     Most recent depression screenings:    03/27/2023    2:26 PM 12/21/2021   10:10 AM  PHQ 2/9 Scores  PHQ - 2 Score 0 0    Vision:Within last year and Dental: No current dental problems    Patient Care Team: Denita Lung, MD as PCP - General (Family Medicine)   Outpatient Medications Prior to Visit  Medication Sig   allopurinol (ZYLOPRIM) 300 MG tablet TAKE 1 TABLET(300 MG) BY MOUTH DAILY   amLODipine (NORVASC) 5 MG tablet Take 1 tablet (5 mg total) by mouth daily.   B-D 3CC LUER-LOK SYR 18GX1-1/2 18G X 1-1/2" 3 ML MISC USE TO INJECT TESTOSTERONE   losartan-hydrochlorothiazide (HYZAAR) 100-12.5 MG tablet TAKE 1 TABLET BY MOUTH DAILY   pimecrolimus (ELIDEL) 1 % cream    SYRINGE-NEEDLE, DISP, 3 ML (B-D 3CC LUER-LOK SYR 18GX1-1/2) 18G X 1-1/2" 3 ML MISC  DRAW UP TESTERONE WITH 18 GAUGE AND AND INJECT WITH ANOTHER 18 GAUGE NEEDLE   testosterone cypionate (DEPOTESTOSTERONE CYPIONATE) 200 MG/ML injection ADMINISTER 3 ML IN THE MUSCLE WEEKLY   [DISCONTINUED] atorvastatin (LIPITOR) 40 MG tablet TAKE 1 TABLET(40 MG) BY MOUTH DAILY   ibuprofen (ADVIL) 800 MG tablet TAKE 1 TABLET(800 MG) BY MOUTH EVERY 8 HOURS AS NEEDED (Patient not taking: Reported on 03/27/2023)   Icosapent Ethyl (VASCEPA PO) Take by mouth daily. (Patient not taking: Reported on 03/27/2023)   tadalafil (CIALIS) 20 MG tablet Take 1 tablet (20 mg total) by mouth daily as needed for erectile dysfunction. (Patient not taking: Reported on 11/19/2022)   No facility-administered medications prior to visit.    Review of Systems  All other systems reviewed and are negative.         Objective:    BP 110/72   Pulse 81   Temp 98 F (36.7 C) (Oral)   Resp 16   Ht 5' 10.5" (1.791 m)   Wt 269 lb (122 kg)   SpO2 97% Comment: room air  BMI 38.05 kg/m    Physical Exam  Alert and in no distress. Tympanic membranes and canals are normal. Pharyngeal area is normal. Neck is  supple without adenopathy or thyromegaly. Cardiac exam shows a regular sinus rhythm without murmurs or gallops. Lungs are clear to auscultation. Urine microscopic did show 0-5 RBCs.    Assessment & Plan:    Routine general medical examination at a health care facility - Plan: POCT Urinalysis DIP (Proadvantage Device)  Primary hypertension  Adenomatous polyp of colon, unspecified part of colon  Hypogonadism male - Plan: Testosterone  Morbid obesity  Hyperlipidemia with target LDL less than 100 - Plan: rosuvastatin (CRESTOR) 40 MG tablet  Idiopathic gout, unspecified chronicity, unspecified site - Plan: Uric Acid  Former smoker  Controlled type 2 diabetes mellitus with complication, without long-term current use of insulin  Screening for prostate cancer - Plan: PSA  Hematuria, unspecified  type  Immunization History  Administered Date(s) Administered   DTaP 02/08/2000   Influenza Split 10/04/2015   Influenza Whole 09/22/2008, 10/07/2009, 09/28/2010   Influenza,inj,Quad PF,6+ Mos 08/31/2013, 08/23/2014, 10/24/2017, 10/13/2018, 09/24/2019, 09/02/2020, 10/17/2021, 11/19/2022   Influenza-Unspecified 09/09/2016, 11/23/2021   Moderna Covid-19 Vaccine Bivalent Booster 25yrs & up 12/21/2021   Moderna SARS-COV2 Booster Vaccination 08/15/2021   Moderna Sars-Covid-2 Vaccination 03/09/2020, 03/26/2020, 10/24/2020   Td 11/07/2015   Tdap 04/11/2009, 11/19/2022   Zoster Recombinat (Shingrix) 08/05/2018, 10/13/2018    Health Maintenance  Topic Date Due   OPHTHALMOLOGY EXAM  Never done   HIV Screening  Never done   COVID-19 Vaccine (7 - 2023-24 season) 08/24/2022   Pneumonia Vaccine 74+ Years old (1 of 1 - PCV) Never done   HEMOGLOBIN A1C  05/20/2023   INFLUENZA VACCINE  07/25/2023   Diabetic kidney evaluation - eGFR measurement  11/20/2023   Diabetic kidney evaluation - Urine ACR  11/20/2023   FOOT EXAM  11/20/2023   COLONOSCOPY (Pts 45-63yrs Insurance coverage will need to be confirmed)  05/31/2025   DTaP/Tdap/Td (5 - Td or Tdap) 11/19/2032   Hepatitis C Screening  Completed   Zoster Vaccines- Shingrix  Completed   HPV VACCINES  Aged Out    Did recommend that he cut back on his alcohol consumption to 1/day. Problem List Items Addressed This Visit     Adenomatous polyp of colon   Controlled type 2 diabetes mellitus with complication, without long-term current use of insulin   Relevant Medications   rosuvastatin (CRESTOR) 40 MG tablet   Former smoker   Gout   Relevant Orders   Uric Acid   Hyperlipidemia with target LDL less than 100   Relevant Medications   rosuvastatin (CRESTOR) 40 MG tablet   Hypertension   Relevant Medications   rosuvastatin (CRESTOR) 40 MG tablet   Hypogonadism male   Relevant Orders   Testosterone   Morbid obesity   Other Visit Diagnoses      Routine general medical examination at a health care facility    -  Primary   Relevant Orders   POCT Urinalysis DIP (Proadvantage Device) (Completed)   Screening for prostate cancer       Relevant Orders   PSA   Hematuria, unspecified type         [facility]. Follow-up as needed as he will be getting his care through the [place]    Jill Alexanders, MD

## 2023-03-28 LAB — URIC ACID: Uric Acid: 4.3 mg/dL (ref 3.8–8.4)

## 2023-03-28 LAB — PSA: Prostate Specific Ag, Serum: 0.6 ng/mL (ref 0.0–4.0)

## 2023-03-28 LAB — TESTOSTERONE: Testosterone: >1500 ng/dL — ABNORMAL HIGH (ref 264–916)

## 2023-04-01 DIAGNOSIS — Z136 Encounter for screening for cardiovascular disorders: Secondary | ICD-10-CM | POA: Diagnosis not present

## 2023-04-02 ENCOUNTER — Telehealth: Payer: Self-pay

## 2023-04-02 NOTE — Telephone Encounter (Signed)
-----   Message from Ronnald Nian, MD sent at 03/28/2023  8:17 AM EDT ----- Let him know that the blood work looks good except testosterone.  Find out when he got the shot in relation to getting the blood drawn.  It looks like we might of drawn the blood too soon

## 2023-04-02 NOTE — Telephone Encounter (Signed)
I called and spoke with patient. His blood work was done on Wednesday, April 3rd. He says his last injection prior to getting his blood drawn was that Sunday, March 31.

## 2023-04-02 NOTE — Telephone Encounter (Signed)
Patient advised and verbalized understanding 

## 2023-05-07 DIAGNOSIS — E041 Nontoxic single thyroid nodule: Secondary | ICD-10-CM | POA: Diagnosis not present

## 2023-05-07 DIAGNOSIS — R918 Other nonspecific abnormal finding of lung field: Secondary | ICD-10-CM | POA: Diagnosis not present

## 2023-05-13 ENCOUNTER — Other Ambulatory Visit: Payer: Self-pay | Admitting: Family Medicine

## 2023-05-13 DIAGNOSIS — M1000 Idiopathic gout, unspecified site: Secondary | ICD-10-CM

## 2023-05-13 DIAGNOSIS — E291 Testicular hypofunction: Secondary | ICD-10-CM

## 2023-05-13 DIAGNOSIS — E041 Nontoxic single thyroid nodule: Secondary | ICD-10-CM | POA: Diagnosis not present

## 2023-05-14 NOTE — Telephone Encounter (Signed)
[facility] not [facility] yet. He is wanting on their call to schedule. Are you ok with refilling testosterone until he can get in to be seen.

## 2023-05-27 DIAGNOSIS — E785 Hyperlipidemia, unspecified: Secondary | ICD-10-CM | POA: Diagnosis not present

## 2023-05-27 DIAGNOSIS — E669 Obesity, unspecified: Secondary | ICD-10-CM | POA: Diagnosis not present

## 2023-05-27 DIAGNOSIS — E041 Nontoxic single thyroid nodule: Secondary | ICD-10-CM | POA: Diagnosis not present

## 2023-05-27 DIAGNOSIS — M109 Gout, unspecified: Secondary | ICD-10-CM | POA: Diagnosis not present

## 2023-05-27 DIAGNOSIS — I10 Essential (primary) hypertension: Secondary | ICD-10-CM | POA: Diagnosis not present

## 2023-05-31 ENCOUNTER — Encounter: Payer: Self-pay | Admitting: *Deleted

## 2023-05-31 NOTE — Progress Notes (Signed)
[facility] Quality Team Note  Name: Jay Delacruz Date of Birth: 1956-10-09 MRN: [id number] Date: 05/31/2023  [facility] Quality Team has reviewed this patient's chart, please see recommendations below:  [facility] Quality Other; Pt has open gap for A1C.  Would need completed before 2024.  Would be able to close gap if within range.

## 2023-06-04 DIAGNOSIS — E669 Obesity, unspecified: Secondary | ICD-10-CM | POA: Diagnosis not present

## 2023-06-04 DIAGNOSIS — E213 Hyperparathyroidism, unspecified: Secondary | ICD-10-CM | POA: Diagnosis not present

## 2023-06-04 DIAGNOSIS — E041 Nontoxic single thyroid nodule: Secondary | ICD-10-CM | POA: Diagnosis not present

## 2023-06-04 DIAGNOSIS — I10 Essential (primary) hypertension: Secondary | ICD-10-CM | POA: Diagnosis not present

## 2023-06-17 DIAGNOSIS — E210 Primary hyperparathyroidism: Secondary | ICD-10-CM | POA: Diagnosis not present

## 2023-07-18 ENCOUNTER — Ambulatory Visit: Payer: Self-pay | Admitting: General Surgery

## 2023-07-28 DIAGNOSIS — R0981 Nasal congestion: Secondary | ICD-10-CM | POA: Diagnosis not present

## 2023-08-05 ENCOUNTER — Other Ambulatory Visit: Payer: Self-pay | Admitting: Family Medicine

## 2023-08-05 DIAGNOSIS — I10 Essential (primary) hypertension: Secondary | ICD-10-CM

## 2023-08-09 DIAGNOSIS — E559 Vitamin D deficiency, unspecified: Secondary | ICD-10-CM | POA: Diagnosis not present

## 2023-08-09 DIAGNOSIS — J310 Chronic rhinitis: Secondary | ICD-10-CM | POA: Diagnosis not present

## 2023-08-09 DIAGNOSIS — E041 Nontoxic single thyroid nodule: Secondary | ICD-10-CM | POA: Diagnosis not present

## 2023-08-09 DIAGNOSIS — E669 Obesity, unspecified: Secondary | ICD-10-CM | POA: Diagnosis not present

## 2023-08-21 ENCOUNTER — Other Ambulatory Visit: Payer: Self-pay | Admitting: Family Medicine

## 2023-08-21 DIAGNOSIS — M1000 Idiopathic gout, unspecified site: Secondary | ICD-10-CM

## 2023-08-28 DIAGNOSIS — E291 Testicular hypofunction: Secondary | ICD-10-CM | POA: Diagnosis not present

## 2023-08-28 DIAGNOSIS — E042 Nontoxic multinodular goiter: Secondary | ICD-10-CM | POA: Diagnosis not present

## 2023-09-07 ENCOUNTER — Other Ambulatory Visit: Payer: Self-pay | Admitting: Family Medicine

## 2023-09-07 DIAGNOSIS — E785 Hyperlipidemia, unspecified: Secondary | ICD-10-CM

## 2023-09-10 ENCOUNTER — Other Ambulatory Visit: Payer: Self-pay | Admitting: Family Medicine

## 2023-09-10 DIAGNOSIS — I10 Essential (primary) hypertension: Secondary | ICD-10-CM

## 2023-09-23 ENCOUNTER — Ambulatory Visit: Payer: Self-pay | Admitting: General Surgery

## 2023-10-01 DIAGNOSIS — L600 Ingrowing nail: Secondary | ICD-10-CM | POA: Diagnosis not present

## 2023-10-01 DIAGNOSIS — Z23 Encounter for immunization: Secondary | ICD-10-CM | POA: Diagnosis not present

## 2023-10-01 DIAGNOSIS — L602 Onychogryphosis: Secondary | ICD-10-CM | POA: Diagnosis not present

## 2023-10-01 DIAGNOSIS — E291 Testicular hypofunction: Secondary | ICD-10-CM | POA: Diagnosis not present

## 2023-10-01 DIAGNOSIS — E042 Nontoxic multinodular goiter: Secondary | ICD-10-CM | POA: Diagnosis not present

## 2023-10-04 NOTE — Patient Instructions (Signed)
DUE TO COVID-19 ONLY TWO VISITORS  (aged 65 and older)  ARE ALLOWED TO COME WITH YOU AND STAY IN THE WAITING ROOM ONLY DURING PRE OP AND PROCEDURE.   **NO VISITORS ARE ALLOWED IN THE SHORT STAY AREA OR RECOVERY ROOM!!**  IF YOU WILL BE ADMITTED INTO THE HOSPITAL YOU ARE ALLOWED ONLY FOUR SUPPORT PEOPLE DURING VISITATION HOURS ONLY (7 AM -8PM)   The support person(s) must pass our screening, gel in and out, and wear a mask at all times, including in the patient's room. Patients must also wear a mask when staff or their support person are in the room. Visitors GUEST BADGE MUST BE WORN VISIBLY  One adult visitor may remain with you overnight and MUST be in the room by 8 P.M.     Your procedure is scheduled on: 10/18/23   Report to [facility] Main Entrance    Report to admitting at : 9:15 AM   Call this number if you have problems the morning of surgery [phone]   Do not eat food :After Midnight.   After Midnight you may have the following liquids until : 8:30 AM DAY OF SURGERY  Water Black Coffee (sugar ok, NO MILK/CREAM OR CREAMERS)  Tea (sugar ok, NO MILK/CREAM OR CREAMERS) regular and decaf                             Plain Jell-O (NO RED)                                           Fruit ices (not with fruit pulp, NO RED)                                     Popsicles (NO RED)                                                                  Juice: apple, WHITE grape, WHITE cranberry Sports drinks like Gatorade (NO RED)             FOLLOW ANY ADDITIONAL PRE OP INSTRUCTIONS YOU RECEIVED FROM YOUR SURGEON'S OFFICE!!!   Oral Hygiene is also important to reduce your risk of infection.                                    Remember - BRUSH YOUR TEETH THE MORNING OF SURGERY WITH YOUR REGULAR TOOTHPASTE  DENTURES WILL BE REMOVED PRIOR TO SURGERY PLEASE DO NOT APPLY "Poly grip" OR ADHESIVES!!!   Do NOT smoke after Midnight   Take these medicines the morning of surgery with A  SIP OF WATER: amlodipine,cetirizine,allopurinol.  DO NOT TAKE ANY ORAL DIABETIC MEDICATIONS DAY OF YOUR SURGERY                              You may not have any metal on your body including hair pins, jewelry, and body  piercing             Do not wear lotions, powders, perfumes/cologne, or deodorant              Men may shave face and neck.   Do not bring valuables to the hospital. [facility] IS NOT             RESPONSIBLE   FOR VALUABLES.   Contacts, glasses, or bridgework may not be worn into surgery.   Bring small overnight bag day of surgery.   DO NOT BRING YOUR HOME MEDICATIONS TO THE HOSPITAL. PHARMACY WILL DISPENSE MEDICATIONS LISTED ON YOUR MEDICATION LIST TO YOU DURING YOUR ADMISSION IN THE HOSPITAL!    Patients discharged on the day of surgery will not be allowed to drive home.  Someone NEEDS to stay with you for the first 24 hours after anesthesia.   Special Instructions: Bring a copy of your healthcare power of attorney and living will documents         the day of surgery if you haven't scanned them before.              Please read over the following fact sheets you were given: IF YOU HAVE QUESTIONS ABOUT YOUR PRE-OP INSTRUCTIONS PLEASE CALL [phone]    [facility] - Preparing for Surgery Before surgery, you can play an important role.  Because skin is not sterile, your skin needs to be as free of germs as possible.  You can reduce the number of germs on your skin by washing with CHG (chlorahexidine gluconate) soap before surgery.  CHG is an antiseptic cleaner which kills germs and bonds with the skin to continue killing germs even after washing. Please DO NOT use if you have an allergy to CHG or antibacterial soaps.  If your skin becomes reddened/irritated stop using the CHG and inform your nurse when you arrive at Short Stay. Do not shave (including legs and underarms) for at least 48 hours prior to the first CHG shower.  You may shave your face/neck. Please follow  these instructions carefully:  1.  Shower with CHG Soap the night before surgery and the  morning of Surgery.  2.  If you choose to wash your hair, wash your hair first as usual with your  normal  shampoo.  3.  After you shampoo, rinse your hair and body thoroughly to remove the  shampoo.                           4.  Use CHG as you would any other liquid soap.  You can apply chg directly  to the skin and wash                       Gently with a scrungie or clean washcloth.  5.  Apply the CHG Soap to your body ONLY FROM THE NECK DOWN.   Do not use on face/ open                           Wound or open sores. Avoid contact with eyes, ears mouth and genitals (private parts).                       Wash face,  Genitals (private parts) with your normal soap.             6.  Wash thoroughly, paying special attention to the area where your surgery  will be performed.  7.  Thoroughly rinse your body with warm water from the neck down.  8.  DO NOT shower/wash with your normal soap after using and rinsing off  the CHG Soap.                9.  Pat yourself dry with a clean towel.            10.  Wear clean pajamas.            11.  Place clean sheets on your bed the night of your first shower and do not  sleep with pets. Day of Surgery : Do not apply any lotions/deodorants the morning of surgery.  Please wear clean clothes to the hospital/surgery center.  FAILURE TO FOLLOW THESE INSTRUCTIONS MAY RESULT IN THE CANCELLATION OF YOUR SURGERY PATIENT SIGNATURE_________________________________  NURSE SIGNATURE__________________________________  ________________________________________________________________________

## 2023-10-07 ENCOUNTER — Encounter (HOSPITAL_COMMUNITY): Payer: Self-pay

## 2023-10-07 ENCOUNTER — Other Ambulatory Visit: Payer: Self-pay

## 2023-10-07 ENCOUNTER — Encounter (HOSPITAL_COMMUNITY)
Admission: RE | Admit: 2023-10-07 | Discharge: 2023-10-07 | Disposition: A | Discharge status: Home / Self Care | Payer: No Typology Code available for payment source | Source: Ambulatory Visit | Attending: General Surgery

## 2023-10-07 VITALS — BP 129/83 | HR 69 | Temp 98.2°F | Ht 72.0 in | Wt 252.0 lb

## 2023-10-07 DIAGNOSIS — Z01818 Encounter for other preprocedural examination: Secondary | ICD-10-CM | POA: Diagnosis present

## 2023-10-07 DIAGNOSIS — I10 Essential (primary) hypertension: Secondary | ICD-10-CM | POA: Insufficient documentation

## 2023-10-07 DIAGNOSIS — E118 Type 2 diabetes mellitus with unspecified complications: Secondary | ICD-10-CM | POA: Diagnosis not present

## 2023-10-07 LAB — BASIC METABOLIC PANEL
Anion gap: 10 (ref 5–15)
BUN: 18 mg/dL (ref 8–23)
CO2: 20 mmol/L — ABNORMAL LOW (ref 22–32)
Calcium: 9.6 mg/dL (ref 8.9–10.3)
Chloride: 107 mmol/L (ref 98–111)
Creatinine, Ser: 0.76 mg/dL (ref 0.61–1.24)
GFR, Estimated: >60 mL/min (ref >60)
Glucose, Bld: 88 mg/dL (ref 70–99)
Potassium: 3.4 mmol/L — ABNORMAL LOW (ref 3.5–5.1)
Sodium: 137 mmol/L (ref 135–145)

## 2023-10-07 LAB — CBC
HCT: 38.8 % — ABNORMAL LOW (ref 39.0–52.0)
Hemoglobin: 12.5 g/dL — ABNORMAL LOW (ref 13.0–17.0)
MCH: 28.3 pg (ref 26.0–34.0)
MCHC: 32.2 g/dL (ref 30.0–36.0)
MCV: 87.8 fL (ref 80.0–100.0)
Platelets: 177 10*3/uL (ref 150–400)
RBC: 4.42 MIL/uL (ref 4.22–5.81)
RDW: 14.8 % (ref 11.5–15.5)
WBC: 3.9 10*3/uL — ABNORMAL LOW (ref 4.0–10.5)
nRBC: 0 % (ref 0.0–0.2)

## 2023-10-07 LAB — HEMOGLOBIN A1C
Hgb A1c MFr Bld: 5.7 % — ABNORMAL HIGH (ref 4.8–5.6)
Mean Plasma Glucose: 116.89 mg/dL

## 2023-10-07 LAB — GLUCOSE, CAPILLARY: Glucose-Capillary: 98 mg/dL (ref 70–99)

## 2023-10-07 NOTE — Progress Notes (Signed)
For Short Stay: COVID SWAB appointment date:  Bowel Prep reminder:   For Anesthesia: PCP - Center, [facility]  Cardiologist - N/A  Chest x-ray -  EKG -  Stress Test -  ECHO -  Cardiac Cath -  Pacemaker/ICD device last checked: Pacemaker orders received: Device Rep notified:  Spinal Cord Stimulator: N/A  Sleep Study - Yes CPAP - Yes  Fasting Blood Sugar - N/A Checks Blood Sugar ___0__ times a day Date and result of last Hgb A1c- 5.6: 10/01/23  Last dose of GLP1 agonist- N/A GLP1 instructions:   Last dose of SGLT-2 inhibitors- N/A SGLT-2 instructions:   Blood Thinner Instructions: N/A Aspirin Instructions: Last Dose:  Activity level: Can go up a flight of stairs and activities of daily living without stopping and without chest pain and/or shortness of breath   Able to exercise without chest pain and/or shortness of breath  Anesthesia review: Hx: HTN,DIA  Patient denies shortness of breath, fever, cough and chest pain at PAT appointment   Patient verbalized understanding of instructions that were given to them at the PAT appointment. Patient was also instructed that they will need to review over the PAT instructions again at home before surgery.

## 2023-10-18 ENCOUNTER — Other Ambulatory Visit: Payer: Self-pay

## 2023-10-18 ENCOUNTER — Encounter (HOSPITAL_COMMUNITY): Payer: Self-pay | Admitting: General Surgery

## 2023-10-18 ENCOUNTER — Ambulatory Visit (HOSPITAL_COMMUNITY): Payer: No Typology Code available for payment source | Admitting: Certified Registered"

## 2023-10-18 ENCOUNTER — Encounter (HOSPITAL_COMMUNITY)
Admission: RE | Disposition: A | Discharge status: Home / Self Care | Payer: Self-pay | Source: Home / Self Care | Attending: General Surgery

## 2023-10-18 ENCOUNTER — Ambulatory Visit (HOSPITAL_BASED_OUTPATIENT_CLINIC_OR_DEPARTMENT_OTHER): Payer: No Typology Code available for payment source | Admitting: Certified Registered"

## 2023-10-18 ENCOUNTER — Observation Stay (HOSPITAL_COMMUNITY)
Admission: RE | Admit: 2023-10-18 | Discharge: 2023-10-19 | Disposition: A | Discharge status: Home / Self Care | Payer: No Typology Code available for payment source | Attending: General Surgery | Admitting: General Surgery

## 2023-10-18 DIAGNOSIS — Z79899 Other long term (current) drug therapy: Secondary | ICD-10-CM | POA: Insufficient documentation

## 2023-10-18 DIAGNOSIS — Z87891 Personal history of nicotine dependence: Secondary | ICD-10-CM | POA: Insufficient documentation

## 2023-10-18 DIAGNOSIS — I10 Essential (primary) hypertension: Secondary | ICD-10-CM

## 2023-10-18 DIAGNOSIS — E041 Nontoxic single thyroid nodule: Principal | ICD-10-CM | POA: Insufficient documentation

## 2023-10-18 HISTORY — PX: THYROID LOBECTOMY: SHX420

## 2023-10-18 LAB — CBC
HCT: 41.6 % (ref 39.0–52.0)
Hemoglobin: 13 g/dL (ref 13.0–17.0)
MCH: 27.9 pg (ref 26.0–34.0)
MCHC: 31.3 g/dL (ref 30.0–36.0)
MCV: 89.3 fL (ref 80.0–100.0)
Platelets: 168 10*3/uL (ref 150–400)
RBC: 4.66 MIL/uL (ref 4.22–5.81)
RDW: 15 % (ref 11.5–15.5)
WBC: 6.9 10*3/uL (ref 4.0–10.5)
nRBC: 0 % (ref 0.0–0.2)

## 2023-10-18 LAB — CREATININE, SERUM
Creatinine, Ser: 0.74 mg/dL (ref 0.61–1.24)
GFR, Estimated: >60 mL/min (ref >60)

## 2023-10-18 LAB — GLUCOSE, CAPILLARY
Glucose-Capillary: 102 mg/dL — ABNORMAL HIGH (ref 70–99)
Glucose-Capillary: 154 mg/dL — ABNORMAL HIGH (ref 70–99)

## 2023-10-18 SURGERY — LOBECTOMY, THYROID
Anesthesia: General | Laterality: Left

## 2023-10-18 MED ORDER — LACTATED RINGERS IV SOLN: INTRAVENOUS | Status: DC

## 2023-10-18 MED ORDER — PHENYLEPHRINE HCL-NACL 20-0.9 MG/250ML-% IV SOLN
INTRAVENOUS | Status: DC | PRN
  Administered 2023-10-18: 25 ug/min via INTRAVENOUS

## 2023-10-18 MED ORDER — FENTANYL CITRATE PF 50 MCG/ML IJ SOSY
PREFILLED_SYRINGE | INTRAMUSCULAR | Status: AC
  Filled 2023-10-18: qty 2

## 2023-10-18 MED ORDER — HYDROMORPHONE HCL 1 MG/ML IJ SOLN
0.5000 mg | INTRAMUSCULAR | Status: DC | PRN
  Administered 2023-10-18: 0.5 mg via INTRAVENOUS

## 2023-10-18 MED ORDER — SUGAMMADEX SODIUM 200 MG/2ML IV SOLN
INTRAVENOUS | Status: DC | PRN
  Administered 2023-10-18: 200 mg via INTRAVENOUS

## 2023-10-18 MED ORDER — LOSARTAN POTASSIUM-HCTZ 100-25 MG PO TABS: 1.0000 | ORAL_TABLET | Freq: Every day | ORAL | Status: DC

## 2023-10-18 MED ORDER — ORAL CARE MOUTH RINSE: 15.0000 mL | Freq: Once | OROMUCOSAL | Status: AC

## 2023-10-18 MED ORDER — BUPIVACAINE-EPINEPHRINE 0.25% -1:200000 IJ SOLN
INTRAMUSCULAR | Status: AC
  Filled 2023-10-18: qty 1

## 2023-10-18 MED ORDER — FENTANYL CITRATE PF 50 MCG/ML IJ SOSY
PREFILLED_SYRINGE | INTRAMUSCULAR | Status: AC
  Filled 2023-10-18: qty 1

## 2023-10-18 MED ORDER — HYDROCHLOROTHIAZIDE 25 MG PO TABS
25.0000 mg | ORAL_TABLET | Freq: Every day | ORAL | Status: DC
  Administered 2023-10-19: 25 mg via ORAL
  Filled 2023-10-18: qty 1

## 2023-10-18 MED ORDER — PROPOFOL 10 MG/ML IV BOLUS
INTRAVENOUS | Status: AC
  Filled 2023-10-18: qty 20

## 2023-10-18 MED ORDER — ROCURONIUM BROMIDE 100 MG/10ML IV SOLN
INTRAVENOUS | Status: DC | PRN
  Administered 2023-10-18: 20 mg via INTRAVENOUS
  Administered 2023-10-18: 60 mg via INTRAVENOUS

## 2023-10-18 MED ORDER — CHLORHEXIDINE GLUCONATE 0.12 % MT SOLN
15.0000 mL | Freq: Once | OROMUCOSAL | Status: AC
Start: 2023-10-18 — End: 2023-10-18
  Administered 2023-10-18: 15 mL via OROMUCOSAL

## 2023-10-18 MED ORDER — DIPHENHYDRAMINE HCL 12.5 MG/5ML PO ELIX: 12.5000 mg | ORAL_SOLUTION | Freq: Four times a day (QID) | ORAL | Status: DC | PRN

## 2023-10-18 MED ORDER — MIDAZOLAM HCL 2 MG/2ML IJ SOLN
INTRAMUSCULAR | Status: AC
  Filled 2023-10-18: qty 2

## 2023-10-18 MED ORDER — FENTANYL CITRATE (PF) 100 MCG/2ML IJ SOLN
INTRAMUSCULAR | Status: DC | PRN
  Administered 2023-10-18 (×2): 50 ug via INTRAVENOUS
  Administered 2023-10-18: 100 ug via INTRAVENOUS

## 2023-10-18 MED ORDER — LEVOTHYROXINE SODIUM 100 MCG PO TABS: 100.0000 ug | ORAL_TABLET | Freq: Every day | ORAL | 0 refills | Status: AC

## 2023-10-18 MED ORDER — CHLORHEXIDINE GLUCONATE CLOTH 2 % EX PADS: 6.0000 | MEDICATED_PAD | Freq: Once | CUTANEOUS | Status: DC

## 2023-10-18 MED ORDER — ONDANSETRON HCL 4 MG/2ML IJ SOLN
INTRAMUSCULAR | Status: DC | PRN
  Administered 2023-10-18: 4 mg via INTRAVENOUS

## 2023-10-18 MED ORDER — OXYCODONE HCL 5 MG PO TABS: 5.0000 mg | ORAL_TABLET | Freq: Once | ORAL | Status: DC | PRN

## 2023-10-18 MED ORDER — PHENYLEPHRINE HCL (PRESSORS) 10 MG/ML IV SOLN
INTRAVENOUS | Status: AC
  Filled 2023-10-18: qty 1

## 2023-10-18 MED ORDER — ENOXAPARIN SODIUM 40 MG/0.4ML IJ SOSY
40.0000 mg | PREFILLED_SYRINGE | INTRAMUSCULAR | Status: DC
  Administered 2023-10-19: 40 mg via SUBCUTANEOUS
  Filled 2023-10-18: qty 0.4

## 2023-10-18 MED ORDER — DEXAMETHASONE SODIUM PHOSPHATE 10 MG/ML IJ SOLN
INTRAMUSCULAR | Status: AC
  Filled 2023-10-18: qty 1

## 2023-10-18 MED ORDER — DEXAMETHASONE SODIUM PHOSPHATE 4 MG/ML IJ SOLN
INTRAMUSCULAR | Status: DC | PRN
  Administered 2023-10-18: 5 mg via INTRAVENOUS

## 2023-10-18 MED ORDER — ONDANSETRON HCL 4 MG/2ML IJ SOLN
INTRAMUSCULAR | Status: AC
  Filled 2023-10-18: qty 2

## 2023-10-18 MED ORDER — OXYCODONE HCL 5 MG PO TABS
5.0000 mg | ORAL_TABLET | ORAL | Status: DC | PRN
  Administered 2023-10-18 – 2023-10-19 (×2): 10 mg via ORAL
  Filled 2023-10-18 (×2): qty 2

## 2023-10-18 MED ORDER — OXYCODONE HCL 5 MG/5ML PO SOLN
5.0000 mg | Freq: Once | ORAL | Status: DC | PRN
Start: 2023-10-18 — End: 2023-10-18

## 2023-10-18 MED ORDER — BUPIVACAINE-EPINEPHRINE 0.25% -1:200000 IJ SOLN
INTRAMUSCULAR | Status: DC | PRN
  Administered 2023-10-18: 5 mL

## 2023-10-18 MED ORDER — LOSARTAN POTASSIUM 50 MG PO TABS
100.0000 mg | ORAL_TABLET | Freq: Every day | ORAL | Status: DC
  Administered 2023-10-19: 100 mg via ORAL
  Filled 2023-10-18: qty 2

## 2023-10-18 MED ORDER — PROPOFOL 10 MG/ML IV BOLUS
INTRAVENOUS | Status: DC | PRN
  Administered 2023-10-18: 200 mg via INTRAVENOUS

## 2023-10-18 MED ORDER — CEFAZOLIN SODIUM-DEXTROSE 2-4 GM/100ML-% IV SOLN
2.0000 g | INTRAVENOUS | Status: AC
  Administered 2023-10-18: 2 g via INTRAVENOUS
  Filled 2023-10-18: qty 100

## 2023-10-18 MED ORDER — TRAMADOL HCL 50 MG PO TABS
50.0000 mg | ORAL_TABLET | Freq: Four times a day (QID) | ORAL | Status: DC | PRN
  Administered 2023-10-18: 50 mg via ORAL

## 2023-10-18 MED ORDER — AMLODIPINE BESYLATE 5 MG PO TABS
5.0000 mg | ORAL_TABLET | Freq: Every day | ORAL | Status: DC
  Administered 2023-10-18: 5 mg via ORAL
  Filled 2023-10-18: qty 1

## 2023-10-18 MED ORDER — ONDANSETRON HCL 4 MG/2ML IJ SOLN: 4.0000 mg | Freq: Four times a day (QID) | INTRAMUSCULAR | Status: DC | PRN

## 2023-10-18 MED ORDER — ACETAMINOPHEN 500 MG PO TABS
1000.0000 mg | ORAL_TABLET | ORAL | Status: AC
  Administered 2023-10-18: 1000 mg via ORAL
  Filled 2023-10-18: qty 2

## 2023-10-18 MED ORDER — TRAMADOL HCL 50 MG PO TABS
ORAL_TABLET | ORAL | Status: AC
  Filled 2023-10-18: qty 1

## 2023-10-18 MED ORDER — VITAMIN D (ERGOCALCIFEROL) 1.25 MG (50000 UNIT) PO CAPS: 50000.0000 [IU] | ORAL_CAPSULE | ORAL | Status: DC

## 2023-10-18 MED ORDER — 0.9 % SODIUM CHLORIDE (POUR BTL) OPTIME
TOPICAL | Status: DC | PRN
  Administered 2023-10-18: 1000 mL

## 2023-10-18 MED ORDER — CALCIUM CARBONATE 1250 (500 CA) MG PO TABS: 1.0000 | ORAL_TABLET | Freq: Two times a day (BID) | ORAL | 0 refills | Status: AC

## 2023-10-18 MED ORDER — TRAMADOL HCL 50 MG PO TABS: 50.0000 mg | ORAL_TABLET | Freq: Three times a day (TID) | ORAL | 0 refills | Status: DC | PRN

## 2023-10-18 MED ORDER — LORATADINE 10 MG PO TABS
10.0000 mg | ORAL_TABLET | Freq: Every day | ORAL | Status: DC
  Administered 2023-10-19: 10 mg via ORAL
  Filled 2023-10-18: qty 1

## 2023-10-18 MED ORDER — LIDOCAINE HCL (CARDIAC) PF 100 MG/5ML IV SOSY
PREFILLED_SYRINGE | INTRAVENOUS | Status: DC | PRN
  Administered 2023-10-18: 60 mg via INTRAVENOUS

## 2023-10-18 MED ORDER — FENTANYL CITRATE PF 50 MCG/ML IJ SOSY
25.0000 ug | PREFILLED_SYRINGE | INTRAMUSCULAR | Status: DC | PRN
  Administered 2023-10-18 (×3): 50 ug via INTRAVENOUS

## 2023-10-18 MED ORDER — FENTANYL CITRATE (PF) 100 MCG/2ML IJ SOLN
INTRAMUSCULAR | Status: AC
  Filled 2023-10-18: qty 2

## 2023-10-18 MED ORDER — ONDANSETRON 4 MG PO TBDP: 4.0000 mg | ORAL_TABLET | Freq: Four times a day (QID) | ORAL | Status: DC | PRN

## 2023-10-18 MED ORDER — HYDROMORPHONE HCL 1 MG/ML IJ SOLN
INTRAMUSCULAR | Status: AC
  Filled 2023-10-18: qty 1

## 2023-10-18 MED ORDER — ACETAMINOPHEN 500 MG PO TABS
1000.0000 mg | ORAL_TABLET | Freq: Four times a day (QID) | ORAL | Status: DC
  Administered 2023-10-18: 1000 mg via ORAL
  Filled 2023-10-18 (×3): qty 2

## 2023-10-18 MED ORDER — LACTATED RINGERS IV SOLN: INTRAVENOUS | Status: DC | PRN

## 2023-10-18 MED ORDER — DIPHENHYDRAMINE HCL 50 MG/ML IJ SOLN: 12.5000 mg | Freq: Four times a day (QID) | INTRAMUSCULAR | Status: DC | PRN

## 2023-10-18 MED ORDER — KETAMINE HCL 50 MG/5ML IJ SOSY
PREFILLED_SYRINGE | INTRAMUSCULAR | Status: AC
  Filled 2023-10-18: qty 5

## 2023-10-18 MED ORDER — ROSUVASTATIN CALCIUM 20 MG PO TABS
40.0000 mg | ORAL_TABLET | Freq: Every day | ORAL | Status: DC
  Administered 2023-10-18 – 2023-10-19 (×2): 40 mg via ORAL
  Filled 2023-10-18 (×2): qty 2

## 2023-10-18 MED ORDER — MIDAZOLAM HCL 5 MG/5ML IJ SOLN
INTRAMUSCULAR | Status: DC | PRN
  Administered 2023-10-18: 2 mg via INTRAVENOUS

## 2023-10-18 MED ORDER — KETAMINE HCL 10 MG/ML IJ SOLN
INTRAMUSCULAR | Status: DC | PRN
  Administered 2023-10-18: 10 mg via INTRAVENOUS
  Administered 2023-10-18: 30 mg via INTRAVENOUS
  Administered 2023-10-18: 10 mg via INTRAVENOUS

## 2023-10-18 SURGICAL SUPPLY — 41 items
ADH SKN CLS APL DERMABOND .7 (GAUZE/BANDAGES/DRESSINGS) ×1
APL PRP STRL LF DISP 70% ISPRP (MISCELLANEOUS) ×1
ATTRACTOMAT 16X20 MAGNETIC DRP (DRAPES) ×1 IMPLANT
BLADE SURG 15 STRL LF DISP TIS (BLADE) ×1 IMPLANT
BLADE SURG 15 STRL SS (BLADE) ×1
CHLORAPREP W/TINT 26 (MISCELLANEOUS) ×1 IMPLANT
CLIP TI MEDIUM 6 (CLIP) ×1 IMPLANT
CLIP TI WIDE RED SMALL 6 (CLIP) ×1 IMPLANT
COVER SURGICAL LIGHT HANDLE (MISCELLANEOUS) ×1 IMPLANT
DERMABOND ADVANCED .7 DNX12 (GAUZE/BANDAGES/DRESSINGS) ×1 IMPLANT
DISSECTOR SURG LIGASURE 21 (MISCELLANEOUS) ×1 IMPLANT
DRAPE LAPAROTOMY T 98X78 PEDS (DRAPES) ×1 IMPLANT
DRAPE UTILITY XL STRL (DRAPES) ×1 IMPLANT
ELECT PENCIL ROCKER SW 15FT (MISCELLANEOUS) ×1 IMPLANT
ELECT REM PT RETURN 15FT ADLT (MISCELLANEOUS) ×1 IMPLANT
GAUZE 4X4 16PLY ~~LOC~~+RFID DBL (SPONGE) ×1 IMPLANT
GLOVE BIOGEL PI IND STRL 7.0 (GLOVE) ×1 IMPLANT
GLOVE SURG SS PI 7.0 STRL IVOR (GLOVE) ×1 IMPLANT
GOWN STRL REUS W/ TWL LRG LVL3 (GOWN DISPOSABLE) ×1 IMPLANT
GOWN STRL REUS W/ TWL XL LVL3 (GOWN DISPOSABLE) IMPLANT
GOWN STRL REUS W/TWL LRG LVL3 (GOWN DISPOSABLE) ×1
GOWN STRL REUS W/TWL XL LVL3 (GOWN DISPOSABLE)
HEMOSTAT SURGICEL 2X4 FIBR (HEMOSTASIS) ×1 IMPLANT
ILLUMINATOR WAVEGUIDE N/F (MISCELLANEOUS) ×1 IMPLANT
KIT BASIN OR (CUSTOM PROCEDURE TRAY) ×1 IMPLANT
KIT TURNOVER KIT A (KITS) IMPLANT
NDL HYPO 25X1 1.5 SAFETY (NEEDLE) ×1 IMPLANT
NEEDLE HYPO 25X1 1.5 SAFETY (NEEDLE) ×1
PACK BASIC VI WITH GOWN DISP (CUSTOM PROCEDURE TRAY) ×1 IMPLANT
STAPLER SKIN PROX WIDE 3.9 (STAPLE) IMPLANT
SUT MNCRL AB 4-0 PS2 18 (SUTURE) ×1 IMPLANT
SUT SILK 2 0 (SUTURE)
SUT SILK 2-0 18XBRD TIE 12 (SUTURE) IMPLANT
SUT SILK 3 0 (SUTURE)
SUT SILK 3-0 18XBRD TIE 12 (SUTURE) IMPLANT
SUT VIC AB 3-0 SH 18 (SUTURE) ×1 IMPLANT
SYR BULB IRRIG 60ML STRL (SYRINGE) ×1 IMPLANT
SYR CONTROL 10ML LL (SYRINGE) ×1 IMPLANT
TOWEL OR 17X26 10 PK STRL BLUE (TOWEL DISPOSABLE) ×1 IMPLANT
TOWEL OR NON WOVEN STRL DISP B (DISPOSABLE) ×1 IMPLANT
TUBING CONNECTING 10 (TUBING) ×1 IMPLANT

## 2023-10-18 NOTE — Anesthesia Preprocedure Evaluation (Signed)
Anesthesia Evaluation  Patient identified by MRN, date of birth, ID band Patient awake    Reviewed: Allergy & Precautions, H&P , NPO status , Patient's Chart, lab work & pertinent test results  Airway Mallampati: II   Neck ROM: full    Dental   Pulmonary former smoker   breath sounds clear to auscultation       Cardiovascular hypertension,  Rhythm:regular Rate:Normal     Neuro/Psych    GI/Hepatic ,GERD  ,,  Endo/Other  diabetes    Renal/GU      Musculoskeletal   Abdominal   Peds  Hematology   Anesthesia Other Findings   Reproductive/Obstetrics                             Anesthesia Physical Anesthesia Plan  ASA: 3  Anesthesia Plan: General   Post-op Pain Management:    Induction: Intravenous  PONV Risk Score and Plan: 2 and Ondansetron, Dexamethasone, Midazolam and Treatment may vary due to age or medical condition  Airway Management Planned: Oral ETT and Video Laryngoscope Planned  Additional Equipment:   Intra-op Plan:   Post-operative Plan: Extubation in OR  Informed Consent: I have reviewed the patients History and Physical, chart, labs and discussed the procedure including the risks, benefits and alternatives for the proposed anesthesia with the patient or authorized representative who has indicated his/her understanding and acceptance.     Dental advisory given  Plan Discussed with: CRNA, Anesthesiologist and Surgeon  Anesthesia Plan Comments:        Anesthesia Quick Evaluation

## 2023-10-18 NOTE — H&P (Signed)
Chief Complaint: New Consultation (THYROID NODULE)  History of Present Illness: Jay Delacruz is a 67 y.o. male who is seen today as an office consultation for evaluation of New Consultation (THYROID NODULE)  Patient underwent CT scan for lung cancer screening and was noted to have an enlarged left thyroid nodule displacing the trachea. He underwent testing showing normal TSH and T4 but elevated PSH. His calcium had previously been within normal limits. He then underwent sestamibi scan showing nonlocalization of an adenoma. He also underwent US guided FNA of the left thyroid nodule showing Bethesda II category cytology.  He has low energy and has been sleeping more. He denies swallowing issues or dyspnea or voice changes. He denies heat/cold intolerance.  Review of Systems: A complete review of systems was obtained from the patient. I have reviewed this information and discussed as appropriate with the patient. See HPI as well for other ROS.  Review of Systems  Constitutional: Negative.  HENT: Negative.  Eyes: Negative.  Respiratory: Negative.  Cardiovascular: Negative.  Gastrointestinal: Negative.  Genitourinary: Negative.  Musculoskeletal: Negative.  Skin: Negative.  Neurological: Negative.  Endo/Heme/Allergies: Negative.  Psychiatric/Behavioral: Negative.    Medical History: Past Medical History:  Diagnosis Date  Hypertension  Sleep apnea  Thyroid disease   There is no problem list on file for this patient.  Past Surgical History:  Procedure Laterality Date  KNEE SURGERY  WRIST SURGERY    Allergies  Allergen Reactions  Lisinopril Swelling  THROAT AND TONGUE   Current Outpatient Medications on File Prior to Visit  Medication Sig Dispense Refill  allopurinoL (ZYLOPRIM) 300 MG tablet Take by mouth  icosapent ethyl (VASCEPA ORAL) Take by mouth  losartan-hydroCHLOROthiazide (HYZAAR) 100-12.5 mg tablet Take by mouth  rosuvastatin (CRESTOR) 40 MG tablet Take 40 mg by  mouth once daily  cholecalciferol (VITAMIN D3) 1000 unit capsule Take 1 capsule (1,000 Units total) by mouth 2 (two) times daily   No current facility-administered medications on file prior to visit.   Family History  Problem Relation Age of Onset  Obesity Mother  Colon cancer Father  Obesity Sister  Stroke Brother  High blood pressure (Hypertension) Brother    Social History   Tobacco Use  Smoking Status Former  Types: Cigarettes  Smokeless Tobacco Never    Social History   Socioeconomic History  Marital status: Married  Tobacco Use  Smoking status: Former  Types: Cigarettes  Smokeless tobacco: Never  Vaping Use  Vaping status: Never Used  Substance and Sexual Activity  Alcohol use: Yes  Drug use: Yes  Comment: MARIJUANA   Objective:   Vitals:  07/18/23 1041  BP: 115/80  Pulse: 66  Temp: 36.5 C (97.7 F)  SpO2: 98%  Weight: (!) 114.7 kg (252 lb 12.8 oz)  Height: 182.9 cm (6')  PainSc: 0-No pain   Body mass index is 34.29 kg/m.  Physical Exam Constitutional:  Appearance: Normal appearance.  HENT:  Head: Normocephalic and atraumatic.  Neck:  Comments: Palpable nodule left side Pulmonary:  Effort: Pulmonary effort is normal.  Musculoskeletal:  General: Normal range of motion.  Cervical back: Normal range of motion.  Neurological:  General: No focal deficit present.  Mental Status: He is alert and oriented to person, place, and time. Mental status is at baseline.  Psychiatric:  Mood and Affect: Mood normal.  Behavior: Behavior normal.  Thought Content: Thought content normal.     Labs, Imaging and Diagnostic Testing: I reviewed images of the sestamibi scan and CT scans, I reviewed  labs as above.  Assessment and Plan:   Diagnoses and all orders for this visit:  Thyroid nodule  Hyperparathyroidism (CMS/HHS-HCC) - Cancel: PTH Intact+Calcium, Ionized - [facility] - Cancel: Vitamin D, 1,25 + 25-Hydroxy - [facility]  Vitamin D deficiency  He  has a large nodule on the left side. After discussion with Dr. Gerrit Friends, the best path forward is left thyroid lobectomy for very large nodule on the left side.   The anatomy and physiology of the thyroid gland and organs of the neck were discussed. Pathophysiology of thyroid problems were discussed. Options were discussed, and I made a recommendation to remove part (and possibly all) of the thyroid gland to treat the pathology.   Risks of bleeding, infection, injury to other organs including nerves, reoperation, death, and other risks were discussed. I noted a good likelihood this will help address the problem. While there are risks, I feel the risks of nonoperative management are greater; therefore, I feel surgery offers the best option. Educational material was given to help further explain the topics & concerns from our discussion. We will work to minimize complications.   In review of labs, his vit D is very low at 15 which can elevate PTH. He had no positive findings on sestamibi. We will supplement vit D and recheck ical, Vit D, and PTH in 3 months

## 2023-10-18 NOTE — Anesthesia Procedure Notes (Signed)
Procedure Name: Intubation Date/Time: 10/18/2023 12:05 PM  Performed by: Deri Fuelling, CRNAPre-anesthesia Checklist: Patient identified, Emergency Drugs available, Suction available and Patient being monitored Patient Re-evaluated:Patient Re-evaluated prior to induction Oxygen Delivery Method: Circle system utilized Preoxygenation: Pre-oxygenation with 100% oxygen Induction Type: IV induction Ventilation: Mask ventilation without difficulty Laryngoscope Size: Glidescope and 3 Grade View: Grade I Tube type: Oral Tube size: 7.5 mm Number of attempts: 1 Airway Equipment and Method: Stylet and Oral airway Placement Confirmation: ETT inserted through vocal cords under direct vision, positive ETCO2 and breath sounds checked- equal and bilateral Secured at: 23 cm Tube secured with: Tape Dental Injury: Teeth and Oropharynx as per pre-operative assessment

## 2023-10-18 NOTE — Anesthesia Postprocedure Evaluation (Signed)
Anesthesia Post Note  Patient: Jay Delacruz  Procedure(s) Performed: LEFT THYROID LOBECTOMY (Left)     Patient location during evaluation: PACU Anesthesia Type: General Level of consciousness: awake and alert Pain management: pain level controlled Vital Signs Assessment: post-procedure vital signs reviewed and stable Respiratory status: spontaneous breathing, nonlabored ventilation, respiratory function stable and patient connected to nasal cannula oxygen Cardiovascular status: blood pressure returned to baseline and stable Postop Assessment: no apparent nausea or vomiting Anesthetic complications: no   No notable events documented.  Last Vitals:    Last Pain:                 Collene Schlichter

## 2023-10-18 NOTE — Transfer of Care (Signed)
Immediate Anesthesia Transfer of Care Note  Patient: Jay Delacruz  Procedure(s) Performed: LEFT THYROID LOBECTOMY (Left)  Patient Location: PACU  Anesthesia Type:General  Level of Consciousness: awake and alert   Airway & Oxygen Therapy: Patient Spontanous Breathing and Patient connected to face mask oxygen  Post-op Assessment: Report given to RN and Post -op Vital signs reviewed and stable  Post vital signs: Reviewed and stable  Last Vitals:  Vitals Value Taken Time  BP 147/94 10/18/23 1401  Temp    Pulse 71 10/18/23 1404  Resp 13 10/18/23 1404  SpO2 100 % 10/18/23 1404  Vitals shown include unfiled device data.  Last Pain:  Vitals:   10/18/23 0942  TempSrc: Oral  PainSc: 0-No pain         Complications: No notable events documented.

## 2023-10-18 NOTE — Op Note (Signed)
Preoperative diagnosis: left thyroid nodule  Postoperative diagnosis: same   Procedure: left thyroid lobectomy  Surgeon: Feliciana Rossetti, M.D.  Asst: Darnell Level, M.D.  Anesthesia: general  Indications for procedure: Jay Delacruz is a 67 y.o. year old male with enlarging left thyroid nodule > 4 cm. Decision was made to remove the nodule as FNA would be unreliable on a large nodule.  Description of procedure: The patient was brought to the operating room and placed in a supine position on the operating room table. Following administration of general anesthesia, the patient was positioned and then prepped and draped in the usual aseptic fashion.   A small Kocher incision was made. Dissection was carried through subcutaneous tissues and platysma. Hemostasis was achieved with the electrocautery. Skin flaps were elevated cephalad and caudad from the thyroid notch to the sternal notch. A Mahorner self-retaining retractor was placed for exposure. Strap muscles were incised in the midline and dissection was begun on the left side.  Strap muscles were reflected laterally. Left thyroid lobe was very large. There were firm nodules in the upper pole. The lower pole was very large but softer.  The left lobe was gently mobilized with blunt dissection. Superior pole vessels were dissected out and divided individually between small and medium ligaclips with the harmonic scalpel. The thyroid lobe was rolled anteriorly. Branches of the inferior thyroid artery were divided between small ligaclips with the harmonic scalpel. Inferior venous tributaries were divided between ligaclips. Both the superior and inferior parathyroid glands were identified and preserved on their vascular pedicles. The recurrent laryngeal nerve was identified and preserved along its course. The ligament of Allyson Sabal was released with the electrocautery and the gland was mobilized onto the anterior trachea. Isthmus was mobilized across the midline.  Dry pack was placed in the left neck.  The neck was irrigated with warm saline. Fibrillar was placed throughout the operative field. Strap muscles were approximated in the midline with interrupted 3-0 Vicryl sutures. Platysma was closed with interrupted 3-0 Vicryl sutures. Skin was closed with a running 4-0 Monocryl subcuticular suture. Wound was washed and Dermabond was applied. The patient was awakened from anesthesia and brought to the recovery room. The patient tolerated the procedure well.   Findings: left thyroid lobe with firm nodules in upper lobe  Specimen: left thyroid lobe  Implant: fibrillar   Blood loss: 30 ml  Local anesthesia:  5 ml marcaine   Complications: none  Feliciana Rossetti, M.D. General, Bariatric, & Minimally Invasive Surgery [facility]

## 2023-10-19 ENCOUNTER — Encounter (HOSPITAL_COMMUNITY): Payer: Self-pay | Admitting: General Surgery

## 2023-10-19 DIAGNOSIS — E041 Nontoxic single thyroid nodule: Secondary | ICD-10-CM | POA: Diagnosis not present

## 2023-10-19 LAB — CBC
HCT: 41.3 % (ref 39.0–52.0)
Hemoglobin: 13 g/dL (ref 13.0–17.0)
MCH: 27.7 pg (ref 26.0–34.0)
MCHC: 31.5 g/dL (ref 30.0–36.0)
MCV: 88.1 fL (ref 80.0–100.0)
Platelets: 170 10*3/uL (ref 150–400)
RBC: 4.69 MIL/uL (ref 4.22–5.81)
RDW: 14.8 % (ref 11.5–15.5)
WBC: 7.9 10*3/uL (ref 4.0–10.5)
nRBC: 0 % (ref 0.0–0.2)

## 2023-10-19 LAB — GLUCOSE, CAPILLARY: Glucose-Capillary: 107 mg/dL — ABNORMAL HIGH (ref 70–99)

## 2023-10-19 MED ORDER — OXYCODONE HCL 5 MG PO TABS: 5.0000 mg | ORAL_TABLET | Freq: Four times a day (QID) | ORAL | 0 refills | Status: AC | PRN

## 2023-10-19 NOTE — Discharge Instructions (Signed)
[facility]      [facility] [phone]  THYROID/ PARATHYROID SURGERY: POST OP INSTRUCTIONS  Always review your discharge instruction sheet given to you by the facility where your surgery was performed.  IF YOU HAVE DISABILITY OR FAMILY LEAVE FORMS, YOU MUST BRING THEM TO THE OFFICE FOR PROCESSING.  PLEASE DO NOT GIVE THEM TO YOUR DOCTOR.  A prescription for pain medication may be given to you upon discharge.  Take your pain medication as prescribed, if needed.  If narcotic pain medicine is not needed, then you may take acetaminophen (Tylenol) or ibuprofen (Advil) as needed. Take your usually prescribed medications unless otherwise directed. If you need a refill on your pain medication, please contact your pharmacy. They will contact our office to request authorization.  Prescriptions will not be filled after 5pm or on week-ends. You should follow a light diet the first 24 hours after arrival home, such as soup and crackers, etc.  Be sure to include lots of fluids daily.  Resume your normal diet the day after surgery. Most patients will experience some swelling and bruising on the chest and neck area.  Ice packs will help.  Swelling and bruising can take several days to resolve.  It is common to experience some constipation if taking pain medication after surgery.  Increasing fluid intake and taking a stool softener will usually help or prevent this problem from occurring.  A mild laxative (Milk of Magnesia or Miralax) should be taken according to package directions if there are no bowel movements after 48 hours. Unless discharge instructions indicate otherwise, you may remove your bandages 24-48 hours after surgery, and you may shower at that time.  You may have steri-strips (small skin tapes) in place directly over the incision.  These strips should be left on the skin for 7-10 days.  If your surgeon used skin glue on the incision, you may shower in 24 hours.  The glue will flake off over  the next 2-3 weeks.  Any sutures or staples will be removed at the office during your follow-up visit. ACTIVITIES:  You may resume regular (light) daily activities beginning the next day--such as daily self-care, walking, climbing stairs--gradually increasing activities as tolerated.  You may have sexual intercourse when it is comfortable.  Refrain from any heavy lifting or straining until approved by your doctor. You may drive when you no longer are taking prescription pain medication, you can comfortably wear a seatbelt, and you can safely maneuver your car and apply brakes RETURN TO WORK:  __________________________________________________________ You should see your doctor in the office for a follow-up appointment approximately two weeks after your surgery.  Make sure that you call for this appointment within a day or two after you arrive home to insure a convenient appointment time. OTHER INSTRUCTIONS: ____________________________________________________________________________ _________________________________________________________________________________________________________________ _________________________________________________________________________________________________________________   WHEN TO CALL YOUR DOCTOR: Fever over 101.0 Inability to urinate Nausea and/or vomiting Extreme swelling or bruising Continued bleeding from incision. Increased pain, redness, or drainage from the incision. Difficulty swallowing or breathing Muscle cramping or spasms. Numbness or tingling in hands or feet or around lips.  The clinic staff is available to answer your questions during regular business hours.  Please don't hesitate to call and ask to speak to one of the nurses if you have concerns.  For further questions, please visit [email or web address]  

## 2023-10-19 NOTE — Plan of Care (Signed)
  Problem: Education: Goal: Knowledge of General Education information will improve Description Including pain rating scale, medication(s)/side effects and non-pharmacologic comfort measures Outcome: Progressing   Problem: Health Behavior/Discharge Planning: Goal: Ability to manage health-related needs will improve Outcome: Progressing   

## 2023-10-19 NOTE — TOC Initial Note (Signed)
Transition of Care [facility]) - Initial/Assessment Note    Patient Details  Name: Jay Delacruz MRN: [id number] Date of Birth: 10/11/1956  Transition of Care [facility]) CM/SW Contact:    Adrian Prows, RN Phone Number: 10/19/2023, 10:45 AM  Clinical Narrative:                 Spoke w/ pt and wife Greggory Maben in room; pt says he is from home and plans to return at d/c; he denies SDOH risks; he has transportation; pt says he has glasses, cane, and grab bars in shower; he does not have HH services, or home oxygen; no TOC needs.  Expected Discharge Plan: Home/Self Care Barriers to Discharge: No Barriers Identified   Patient Goals and CMS Choice Patient states their goals for this hospitalization and ongoing recovery are:: home   Choice offered to / list presented to : NA      Expected Discharge Plan and Services   Discharge Planning Services: CM Consult Post Acute Care Choice: NA Living arrangements for the past 2 months: Single Family Home Expected Discharge Date: 10/19/23               DME Arranged: N/A DME Agency: NA       HH Arranged: NA HH Agency: NA        Prior Living Arrangements/Services Living arrangements for the past 2 months: Single Family Home Lives with:: Spouse Patient language and need for interpreter reviewed:: Yes Do you feel safe going back to the place where you live?: Yes      Need for Family Participation in Patient Care: Yes (Comment) Care giver support system in place?: Yes (comment) Current home services: DME (cane) Criminal Activity/Legal Involvement Pertinent to Current Situation/Hospitalization: No - Comment as needed  Activities of Daily Living   ADL Screening (condition at time of admission) Independently performs ADLs?: Yes (appropriate for developmental age) Is the patient deaf or have difficulty hearing?: No Does the patient have difficulty seeing, even when wearing glasses/contacts?: No Does the patient have difficulty  concentrating, remembering, or making decisions?: No  Permission Sought/Granted Permission sought to share information with : Case Manager Permission granted to share information with : Yes, Verbal Permission Granted  Share Information with NAME: Case Manager     Permission granted to share info w Relationship: Aaryan Congdon (spouse) [phone]     Emotional Assessment Appearance:: Appears stated age Attitude/Demeanor/Rapport: Gracious Affect (typically observed): Accepting Orientation: : Oriented to Self, Oriented to Place, Oriented to  Time, Oriented to Situation Alcohol / Substance Use: Not Applicable Psych Involvement: No (comment)  Admission diagnosis:  Thyroid nodule [E04.1] Patient Active Problem List   Diagnosis Date Noted   Thyroid nodule 10/18/2023   Controlled type 2 diabetes mellitus with complication, without long-term current use of insulin (HCC) 03/26/2023   Adenomatous polyp of colon 12/21/2021   Morbid obesity (HCC) 12/14/2020   Esophageal reflux 08/07/2016   Former smoker 08/07/2016   Gout 04/29/2012   Hyperlipidemia with target LDL less than 100 04/29/2012   Hypogonadism male 04/29/2012   Hypertension 04/29/2012   PCP:  [facility]:   [id number] - [place], [place] - [address] AT [facility] [address] [place] [address] Phone: [phone] Fax: [phone]     Social Determinants of Health (SDOH) Social History: SDOH Screenings   Food Insecurity: No Food Insecurity (10/19/2023)  Housing: Low Risk  (10/19/2023)  Transportation Needs: No Transportation Needs (10/19/2023)  Utilities: Not At Risk (10/19/2023)  Depression (PHQ2-9): Low Risk  (03/27/2023)  Tobacco Use: Medium Risk (10/18/2023)   SDOH Interventions: Food Insecurity Interventions: Intervention Not Indicated, Inpatient TOC Housing Interventions: Intervention Not Indicated, Inpatient TOC Transportation  Interventions: Intervention Not Indicated, Inpatient TOC Utilities Interventions: Intervention Not Indicated, Inpatient TOC   Readmission Risk Interventions     No data to display

## 2023-10-19 NOTE — Progress Notes (Signed)
Mobility Specialist - Progress Note   10/19/23 1052  Mobility  Activity Ambulated with assistance in hallway  Level of Assistance Modified independent, requires aide device or extra time  Assistive Device Front wheel walker  Distance Ambulated (ft) 480 ft  Activity Response Tolerated well  Mobility Referral Yes  $Mobility charge 1 Mobility  Mobility Specialist Start Time (ACUTE ONLY) [id number]  Mobility Specialist Stop Time (ACUTE ONLY) 0945  Mobility Specialist Time Calculation (min) (ACUTE ONLY) 7 min   Pt received in bed and agreeable to mobility. No complaints during session. Pt to bed after session with all needs met.    [facility]

## 2023-10-20 LAB — CALCIUM, IONIZED: Calcium, Ionized, Serum: 5.6 mg/dL (ref 4.5–5.6)

## 2023-10-21 LAB — SURGICAL PATHOLOGY

## 2023-10-25 NOTE — Discharge Summary (Signed)
Physician Discharge Summary  BRIYAN KLEVEN BJY:[id number] DOB: [birth year] DOA: 10/18/2023  PCP: [facility]  Admit date: 10/18/2023 Discharge date: 10/19/2023 10/25/2023   Recommendations for Outpatient Follow-up:   (include homehealth, outpatient follow-up instructions, specific recommendations for PCP to follow-up on, etc.)   Follow-up Information     Elishia Kaczorowski, De Blanch, MD Follow up in 2 week(s).   Specialty: General Surgery Contact information: [address] [place] [address] [phone]                Discharge Diagnoses:  Principal Problem:   Thyroid nodule   Surgical Procedure: left thyroid lobectomy  Discharge Condition: Good Disposition: Home  Diet recommendation: reg diet   Hospital Course:  69 y male underwent left thyroid lobectomy for large nodule. Post op he did well. He had no hoarseness. His pain was controlled and he was discharged POD 1.  Discharge Instructions  Discharge Instructions     Call MD for:  persistant nausea and vomiting   Complete by: As directed    Call MD for:  redness, tenderness, or signs of infection (pain, swelling, redness, odor or green/yellow discharge around incision site)   Complete by: As directed    Call MD for:  severe uncontrolled pain   Complete by: As directed    Call MD for:  temperature >100.4   Complete by: As directed    Diet general   Complete by: As directed    Driving Restrictions   Complete by: As directed    Do not drive while taking pain medications   Increase activity slowly   Complete by: As directed    May shower / Bathe   Complete by: As directed       Allergies as of 10/19/2023       Reactions   Lisinopril Swelling   SWELLING TO FACE AND TONGUE        Medication List     TAKE these medications    allopurinol 300 MG tablet Commonly known as: ZYLOPRIM TAKE 1 TABLET(300 MG) BY MOUTH DAILY What changed: See the new instructions.    amLODipine 5 MG tablet Commonly known as: NORVASC TAKE 1 TABLET(5 MG) BY MOUTH DAILY What changed: See the new instructions.   cadexomer iodine 0.9 % gel Commonly known as: IODOSORB Apply 1 Application topically daily as needed for wound care ((affected toe nail)).   calcium carbonate 1250 (500 Ca) MG tablet Commonly known as: OS-CAL - dosed in mg of elemental calcium Take 1 tablet (1,250 mg total) by mouth 2 (two) times daily with a meal for 60 doses.   cetirizine 10 MG tablet Commonly known as: ZYRTEC Take 10 mg by mouth daily in the afternoon.   ibuprofen 800 MG tablet Commonly known as: ADVIL Take 800 mg by mouth every 8 (eight) hours as needed (pain.).   levothyroxine 100 MCG tablet Commonly known as: Synthroid Take 1 tablet (100 mcg total) by mouth daily before breakfast.   losartan-hydrochlorothiazide 100-25 MG tablet Commonly known as: HYZAAR Take 1 tablet by mouth daily in the afternoon. What changed: Another medication with the same name was removed. Continue taking this medication, and follow the directions you see here.   oxyCODONE 5 MG immediate release tablet Commonly known as: Oxy IR/ROXICODONE Take 1 tablet (5 mg total) by mouth every 6 (six) hours as needed for moderate pain (pain score 4-6).   phentermine 15 MG capsule Take 15 mg by mouth daily in the  afternoon.   rosuvastatin 40 MG tablet Commonly known as: CRESTOR Take 1 tablet (40 mg total) by mouth daily. What changed: when to take this   Vitamin D (Ergocalciferol) 1.25 MG (50000 UNIT) Caps capsule Commonly known as: DRISDOL Take 50,000 Units by mouth every Sunday.        Follow-up Information     Sarye Kath, De Blanch, MD Follow up in 2 week(s).   Specialty: General Surgery Contact information: [address] [place] [address] [phone]                  The results of significant diagnostics from this hospitalization (including imaging, microbiology,  ancillary and laboratory) are listed below for reference.    Significant Diagnostic Studies: No results found.  Labs: Basic Metabolic Panel: Recent Labs  Lab 10/18/23 1840  CREATININE 0.74   Liver Function Tests: No results for input(s): "AST", "ALT", "ALKPHOS", "BILITOT", "PROT", "ALBUMIN" in the last 168 hours.  CBC: Recent Labs  Lab 10/18/23 1840 10/19/23 0526  WBC 6.9 7.9  HGB 13.0 13.0  HCT 41.6 41.3  MCV 89.3 88.1  PLT 168 170    CBG: Recent Labs  Lab 10/18/23 1952 10/19/23 0724  GLUCAP 154* 107*    Principal Problem:   Thyroid nodule   Time coordinating discharge: 15 min

## 2023-11-15 DIAGNOSIS — E66811 Obesity, class 1: Secondary | ICD-10-CM | POA: Diagnosis not present

## 2023-11-15 DIAGNOSIS — E041 Nontoxic single thyroid nodule: Secondary | ICD-10-CM | POA: Diagnosis not present

## 2023-11-15 DIAGNOSIS — E559 Vitamin D deficiency, unspecified: Secondary | ICD-10-CM | POA: Diagnosis not present

## 2023-11-15 DIAGNOSIS — I10 Essential (primary) hypertension: Secondary | ICD-10-CM | POA: Diagnosis not present

## 2023-11-16 ENCOUNTER — Other Ambulatory Visit: Payer: Self-pay | Admitting: Family Medicine

## 2023-11-18 NOTE — Telephone Encounter (Signed)
Last apt 03/27/23 not sure if he s still yor pt. Has [facility].

## 2023-11-30 DIAGNOSIS — Z5321 Procedure and treatment not carried out due to patient leaving prior to being seen by health care provider: Secondary | ICD-10-CM | POA: Diagnosis not present

## 2024-01-01 DIAGNOSIS — E291 Testicular hypofunction: Secondary | ICD-10-CM | POA: Diagnosis not present

## 2024-01-01 DIAGNOSIS — Z0389 Encounter for observation for other suspected diseases and conditions ruled out: Secondary | ICD-10-CM | POA: Diagnosis not present

## 2024-01-01 DIAGNOSIS — E042 Nontoxic multinodular goiter: Secondary | ICD-10-CM | POA: Diagnosis not present

## 2024-01-01 DIAGNOSIS — E213 Hyperparathyroidism, unspecified: Secondary | ICD-10-CM | POA: Diagnosis not present

## 2024-01-23 DIAGNOSIS — Z7189 Other specified counseling: Secondary | ICD-10-CM | POA: Diagnosis not present

## 2024-03-06 ENCOUNTER — Other Ambulatory Visit: Payer: Self-pay | Admitting: Family Medicine

## 2024-03-06 DIAGNOSIS — I10 Essential (primary) hypertension: Secondary | ICD-10-CM

## 2024-04-07 ENCOUNTER — Encounter: Payer: Federal, State, Local not specified - PPO | Admitting: Family Medicine

## 2024-06-04 ENCOUNTER — Other Ambulatory Visit: Payer: Self-pay | Admitting: Family Medicine

## 2024-06-04 DIAGNOSIS — E785 Hyperlipidemia, unspecified: Secondary | ICD-10-CM
